# Patient Record
Sex: Male | Born: 1955 | Race: White | Hispanic: No | Marital: Single | Smoking: Former smoker
Health system: Southern US, Community
[De-identification: ages and names within clinical notes are randomized; demographics above are authoritative.]

## PROBLEM LIST (undated history)

## (undated) DIAGNOSIS — J45909 Unspecified asthma, uncomplicated: Secondary | ICD-10-CM

## (undated) DIAGNOSIS — K469 Unspecified abdominal hernia without obstruction or gangrene: Secondary | ICD-10-CM

## (undated) DIAGNOSIS — I10 Essential (primary) hypertension: Secondary | ICD-10-CM

## (undated) DIAGNOSIS — L409 Psoriasis, unspecified: Secondary | ICD-10-CM

## (undated) DIAGNOSIS — N200 Calculus of kidney: Secondary | ICD-10-CM

## (undated) DIAGNOSIS — E669 Obesity, unspecified: Secondary | ICD-10-CM

## (undated) DIAGNOSIS — IMO0002 Reserved for concepts with insufficient information to code with codable children: Secondary | ICD-10-CM

## (undated) DIAGNOSIS — M109 Gout, unspecified: Secondary | ICD-10-CM

## (undated) DIAGNOSIS — M1990 Unspecified osteoarthritis, unspecified site: Secondary | ICD-10-CM

## (undated) DIAGNOSIS — M549 Dorsalgia, unspecified: Secondary | ICD-10-CM

## (undated) HISTORY — DX: Dorsalgia, unspecified: M54.9

## (undated) HISTORY — DX: Unspecified abdominal hernia without obstruction or gangrene: K46.9

## (undated) HISTORY — DX: Psoriasis, unspecified: L40.9

## (undated) HISTORY — DX: Reserved for concepts with insufficient information to code with codable children: IMO0002

## (undated) HISTORY — DX: Unspecified asthma, uncomplicated: J45.909

## (undated) HISTORY — DX: Essential (primary) hypertension: I10

## (undated) HISTORY — DX: Calculus of kidney: N20.0

## (undated) HISTORY — DX: Obesity, unspecified: E66.9

## (undated) HISTORY — DX: Unspecified osteoarthritis, unspecified site: M19.90

---
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
## 1988-04-15 DIAGNOSIS — N200 Calculus of kidney: Secondary | ICD-10-CM

## 1988-04-15 HISTORY — DX: Calculus of kidney: N20.0

## 2006-04-15 DIAGNOSIS — L409 Psoriasis, unspecified: Secondary | ICD-10-CM

## 2006-04-15 HISTORY — DX: Psoriasis, unspecified: L40.9

## 2007-04-16 DIAGNOSIS — I10 Essential (primary) hypertension: Secondary | ICD-10-CM

## 2007-04-16 HISTORY — DX: Essential (primary) hypertension: I10

## 2008-04-15 DIAGNOSIS — K469 Unspecified abdominal hernia without obstruction or gangrene: Secondary | ICD-10-CM

## 2008-04-15 HISTORY — DX: Unspecified abdominal hernia without obstruction or gangrene: K46.9

## 2008-04-15 HISTORY — PX: HERNIA REPAIR: SHX51

## 2009-01-21 ENCOUNTER — Inpatient Hospital Stay: Payer: Self-pay | Admitting: General Surgery

## 2009-04-15 HISTORY — PX: KIDNEY STONE SURGERY: SHX686

## 2009-10-05 ENCOUNTER — Ambulatory Visit: Payer: Self-pay | Admitting: Urology

## 2010-03-28 ENCOUNTER — Ambulatory Visit: Payer: Self-pay | Admitting: Urology

## 2011-04-16 DIAGNOSIS — M549 Dorsalgia, unspecified: Secondary | ICD-10-CM

## 2011-04-16 HISTORY — DX: Dorsalgia, unspecified: M54.9

## 2012-03-09 ENCOUNTER — Ambulatory Visit: Payer: Self-pay | Admitting: General Surgery

## 2012-03-10 ENCOUNTER — Ambulatory Visit: Payer: Self-pay | Admitting: Anesthesiology

## 2012-03-10 LAB — CBC WITH DIFFERENTIAL/PLATELET
Basophil %: 0.9 %
Eosinophil #: 0.4 10*3/uL (ref 0.0–0.7)
HGB: 14.9 g/dL (ref 13.0–18.0)
Lymphocyte #: 1.7 10*3/uL (ref 1.0–3.6)
Lymphocyte %: 19.5 %
MCHC: 33.7 g/dL (ref 32.0–36.0)
MCV: 93 fL (ref 80–100)
Neutrophil #: 5.9 10*3/uL (ref 1.4–6.5)
RBC: 4.75 10*6/uL (ref 4.40–5.90)

## 2012-03-10 LAB — BASIC METABOLIC PANEL
BUN: 22 mg/dL — ABNORMAL HIGH (ref 7–18)
Calcium, Total: 9.4 mg/dL (ref 8.5–10.1)
Chloride: 108 mmol/L — ABNORMAL HIGH (ref 98–107)
Co2: 29 mmol/L (ref 21–32)
Creatinine: 1.22 mg/dL (ref 0.60–1.30)
EGFR (Non-African Amer.): >60
Osmolality: 283 (ref 275–301)
Potassium: 4.4 mmol/L (ref 3.5–5.1)
Sodium: 141 mmol/L (ref 136–145)

## 2012-03-17 ENCOUNTER — Ambulatory Visit: Payer: Self-pay | Admitting: General Surgery

## 2012-03-28 ENCOUNTER — Encounter: Payer: Self-pay | Admitting: *Deleted

## 2014-06-13 IMAGING — CT CT ABDOMEN W/ CM
1 of 4 series · 13 of 32 positions shown, 19 images · non-contrast
Comparison: none

REASON FOR EXAM: LABS 1st recurrent incisional hernia
COMMENTS:

[Series 2: abd 3mm w 3.0 i40f 3 · axial · 0.87mm/px · z∈[-986,-698]mm · 13 of 113 slices shown, 19 images]
[im 9/113  soft-tissue]
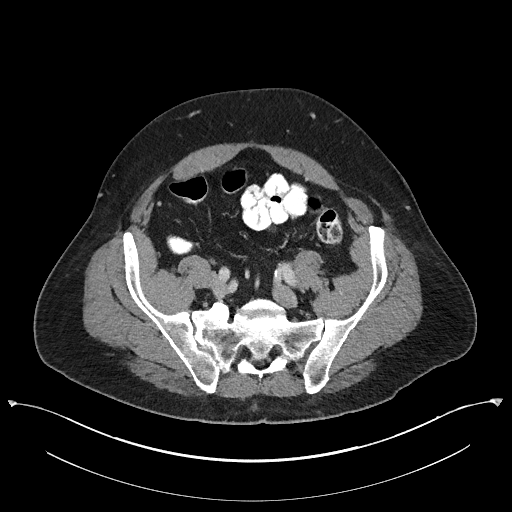
[im 9/113  bone]
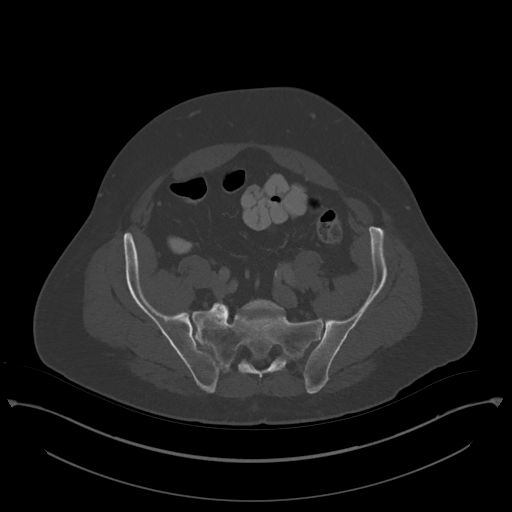
[im 17/113  soft-tissue]
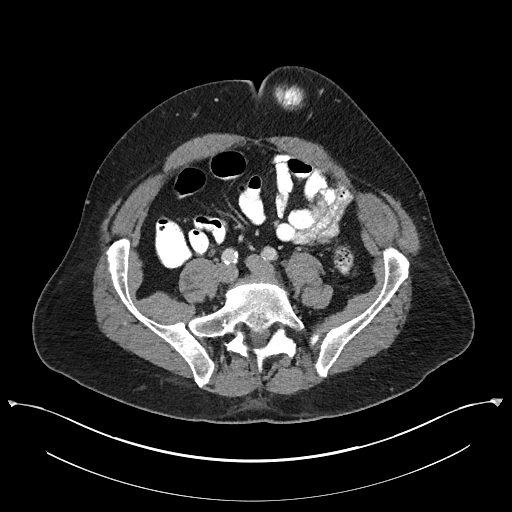
[im 25/113  soft-tissue]
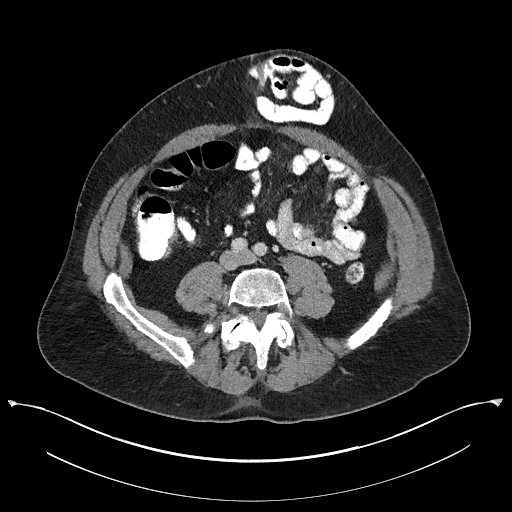
[im 33/113  soft-tissue]
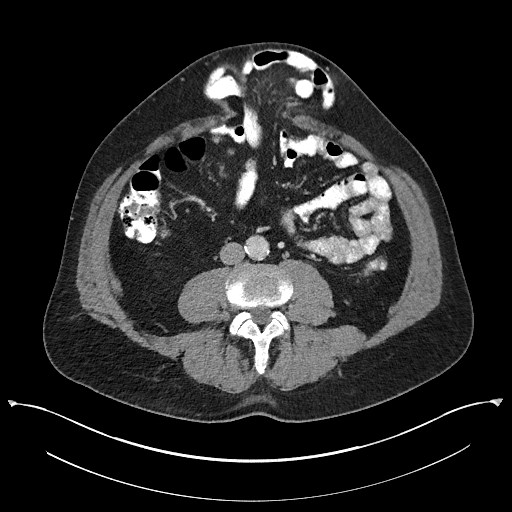
[im 41/113  soft-tissue]
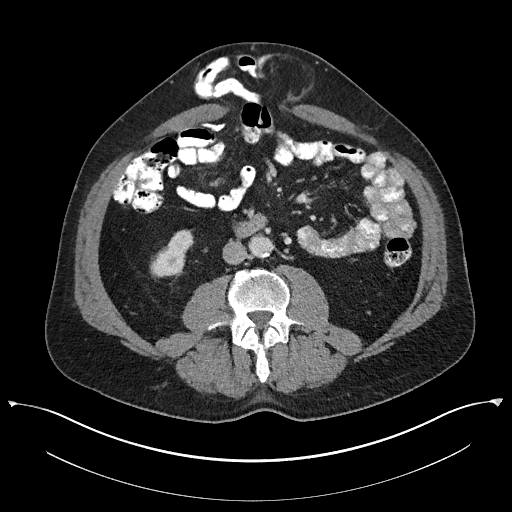
[im 49/113  soft-tissue]
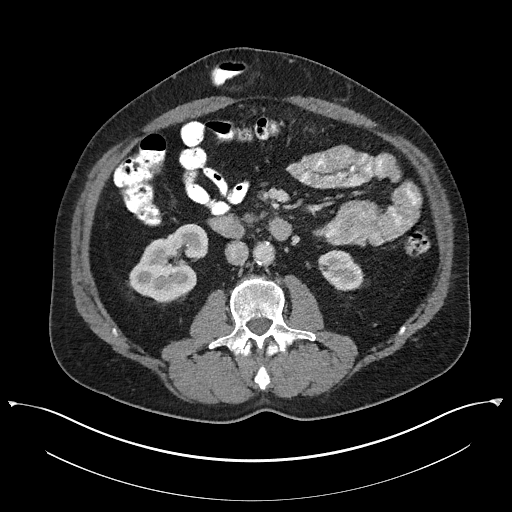
[im 57/113  soft-tissue]
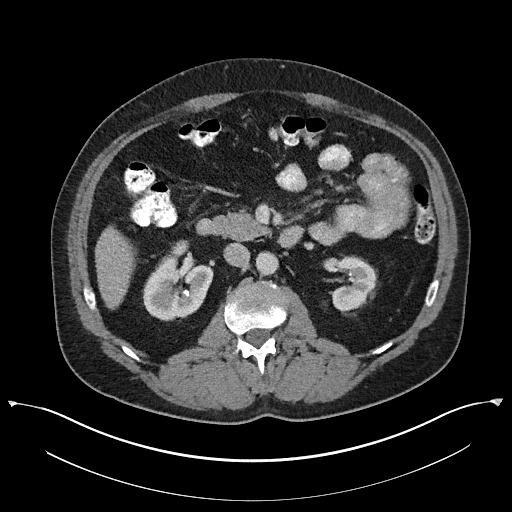
[im 65/113  soft-tissue]
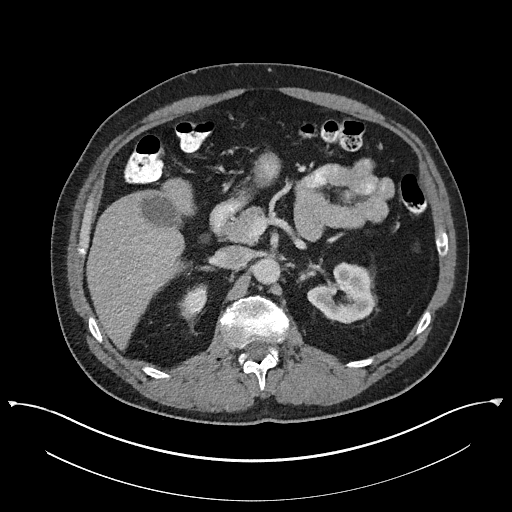
[im 73/113  soft-tissue]
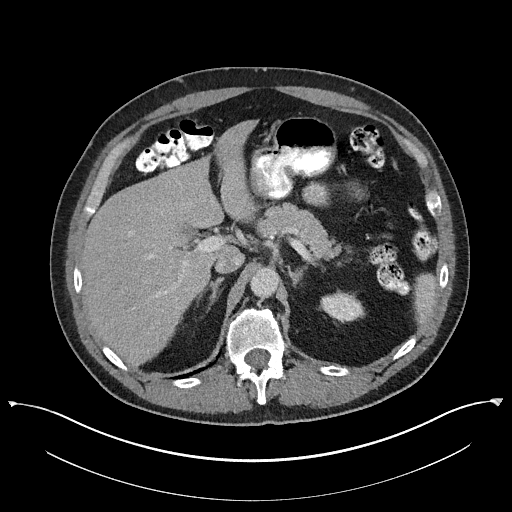
[im 73/113  bone]
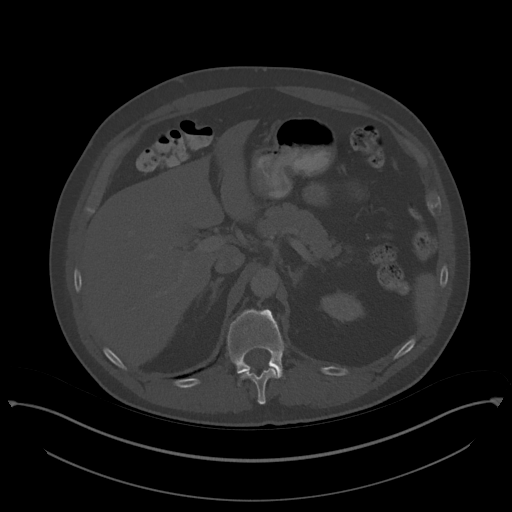
[im 81/113  soft-tissue]
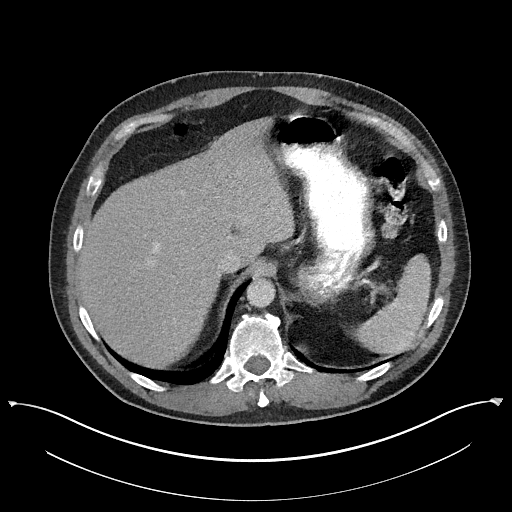
[im 81/113  lung]
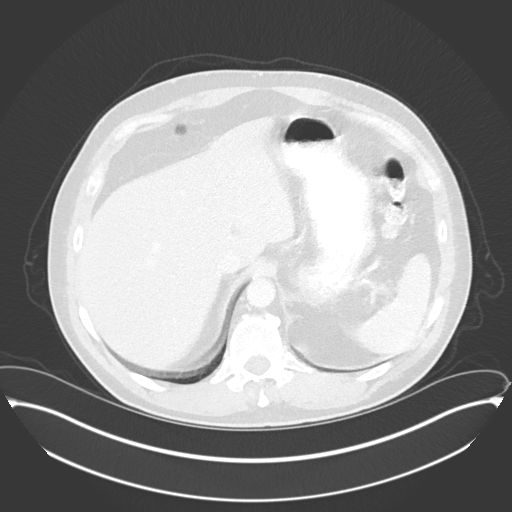
[im 89/113  soft-tissue]
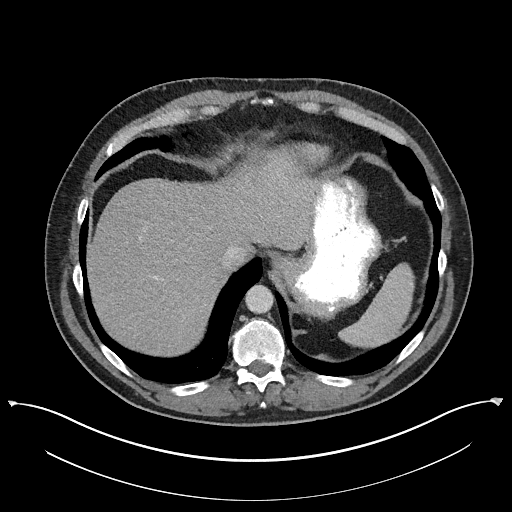
[im 89/113  lung]
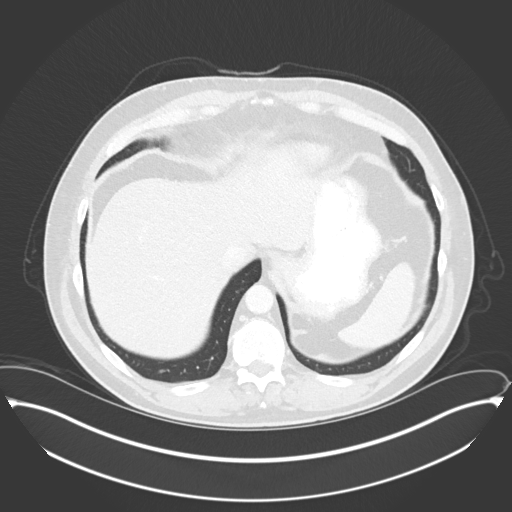
[im 97/113  soft-tissue]
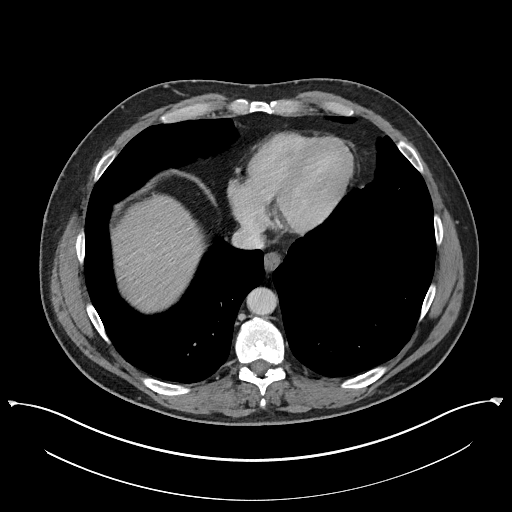
[im 97/113  lung]
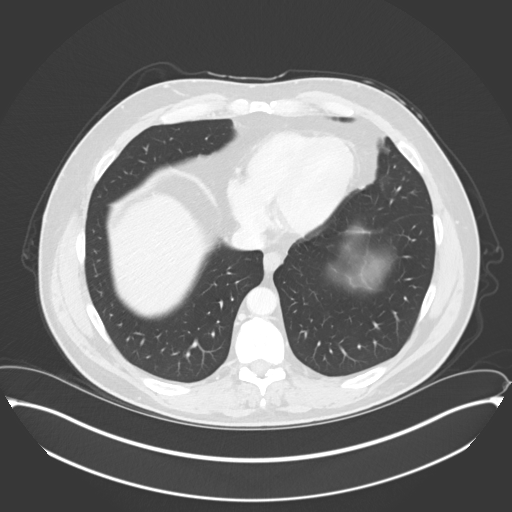
[im 105/113  soft-tissue]
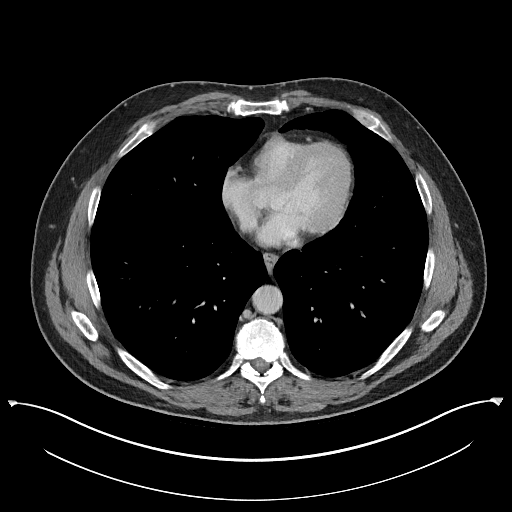
[im 105/113  lung]
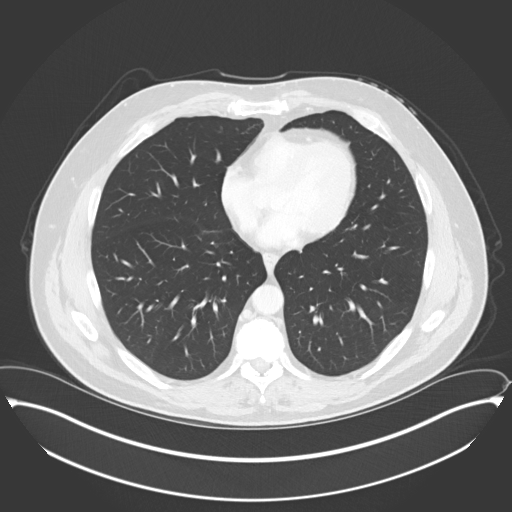

[13 of 32 positions shown; findings below may reference images not displayed]

PROCEDURE:     KCT - KCT ABDOMEN STANDARD W  - March 09, 2012  [DATE]

RESULT:     Axial CT scanning was performed through the abdomen with
reconstructions at 3 mm intervals and slice thicknesses following
intravenous administration of 85 cc of Isovue 300 as well as administration
of oral contrast material. Review of multiplanar reconstructed images was
performed separately on the VIA monitor.

There is a broadly necked ventral hernia containing loops of small bowel.
These loops are not abnormally distended. The remainder of the small and
large bowel exhibits no evidence of ileus or obstruction were visualized.
The kidneys contain nonobstructing stones measuring up to 4 mm in diameter.
The liver, gallbladder, pancreas, spleen, partially distended stomach, and
adrenal glands are normal in appearance. The caliber of the abdominal aorta
is normal. The lumbar vertebral bodies are preserved in height. The lung
bases are clear.

On delayed images contrast within the renal collecting systems appears
normal.
IMPRESSION: 1. There is a large midline ventral hernia containing numerous loops of
normal appearing small bowel as well is normal-appearing fat. There is no
evidence of a small or large bowel obstruction.
2. There is no evidence of acute hepatobiliary abnormality.
3. There are multiple subcentimeter nonobstructing kidney stones.

[REDACTED]

## 2015-05-16 NOTE — Discharge Summary (Signed)
 Discharge Summary  Admit date: 05/15/2015  Discharge date and time: 05/16/2015   Discharge to:  Home  Discharge Service: Cleop[facility] [place] [phone])  Discharge Attending Physician: Hoy Hails, MD  Discharge  Diagnoses: Incisional Hernia  Secondary Diagnosis: Active Problems:   * No active hospital problems. *   OR Procedures: Procedure(s): ROBOTIC XI LAPAROSCOPY, SURGICAL, REPAIR, INCISIONAL HERNIA; REDUCIBLE 05/15/2015   Ancillary Procedures: no procedures  Discharge Day Services:  The patient was seen and examined by the GI Surgery team on the day of discharge. Vital signs and laboratory values were stable and within normal limits. Surgical wounds were examined. Discharge plan was discussed, instructions were given, and all questions answered  Objective   T: 36 P 80 Resp 17 BP 114/78  Gen: NAD, lying in bed CV: RRR Pulm: Breathing comfortably on room air Abd: S/NT/ND, laparoscopic incisions c/d/i w/ no surrounding erythema or drainage Ext: [facility]  Hospital Course:   The patient was taken to the OR on 05/15/15 for a robotic ventral hernia repair with mesh. He tolerated the procedure well, was extubated in the OR, and was taken to the PACU where he received routine postoperative care before being transferred to the floor. He did well postoperatively.  His diet was slowly advanced and at the time of discharge he was tolerating the expected diet.  The patient was able to void spontaneously, have his pain controlled with P.O. pain medication, and ambulate well with minimal assistance. He is being discharged home on POD 1 in stable condition.  Condition at Discharge: Improved Discharge Medications:    Your Medication List    START taking these medications         HYDROcodone-acetaminophen  5-325 mg per tablet  Commonly known as:  NORCO  Take 1 tablet by mouth every four (4) hours as needed. for up to 7 days       CONTINUE taking these medications         clobetasol 0.05 %  ointment  Commonly known as:  TEMOVATE  Apply topically Two (2) times a day.     etanercept 50 mg/mL (0.98 mL) Pnij  Commonly known as:  ENBREL  Inject 1 mL (50 mg total) under the skin Two (2) times a week.     hydroCHLOROthiazide  25 MG tablet  Commonly known as:  HYDRODIURIL   Take 2 tablets (50 mg total) by mouth daily.     triamcinolone  0.1 % ointment  Commonly known as:  KENALOG   Apply topically Two (2) times a day.          Discharge Instructions:  Labs and Other Follow-ups after Discharge:     Follow Up instructions and Outpatient Referrals    Discharge instructions      ACTIVITY:  - You may participate in general, routine activity as tolerated.   - Avoid lifting >10-15 lb or significant straining until seen for follow-up.  - You should wear your abdominal binder for comfort.   DIET:   - Resume your regular diet, as tolerated, unless specifically directed otherwise.   - You may experience bloating or have loose, watery stool for several days after surgery.  INCISION CARE:  --Look at your incisions at least twice a day. A small amount of drainage is normal.  --If your doctor put surgical glue on your incisions, it will fall off gradually by itself, usually about 7-10 days after surgery. Do not pull them off yourself. You may trim the edges should they start to curl. You can  put gauze over the incisions for the first week to protect clothing from any drainage. Avoid wearing tight clothing. --Loosely pack open portions of wound with moist gauze twice daily.  Cover with dry gauze and paper tape.  MEDICATIONS:  - Take prescribed medications as instructed. Do not drive while taking narcotics. You should take an over the counter stool softener while taking narcotic medications.   - Restart any home medications you may have been taking.  GENERAL CONCERNS AND FOLLOW-UP:   - Call at the numbers below for: fever >101.64F, uncontrolled nausea or vomiting, pain uncontrolled  with prescribed medication, or inability to pass stool for more than 3 days; also call for any new or concerning symptoms.    - If you did not receive your followup appointment details at the time of discharge and need an appointment, please  contact the GI Surgery Clinic at [phone].   CONTACTS: - During regular business hours: (Monday-Friday, 8am-4:30 pm) you may call the GI Surgery Clinic at [phone].  - During regular business hours you may call the Trauma/General Surgery Clinic at [phone] - For emergencies hours business hours please call the [facility] operator at [phone] and ask for the Surgical Resident on-call.              Future Appointments:   Inbasket message sent to GI surgery clinic office.   I saw and evaluated the patient, participating in the key portions of the service.  I reviewed the resident's note.  I agree with the resident's findings and plan.   Hoy JAYSON Hails, MD MBA Division of GI and Bariatric Surgery 05/17/2015 10:18 AM

## 2022-01-30 ENCOUNTER — Ambulatory Visit
Admission: EM | Admit: 2022-01-30 | Discharge: 2022-01-30 | Disposition: A | Discharge status: Home / Self Care | Payer: Non-veteran care

## 2022-01-30 DIAGNOSIS — M109 Gout, unspecified: Secondary | ICD-10-CM

## 2022-01-30 MED ORDER — COLCHICINE 0.6 MG PO TABS: ORAL_TABLET | ORAL | 0 refills | Status: AC

## 2022-01-30 NOTE — ED Triage Notes (Signed)
Pt. Presents to UC w/ right foot pain and redness since this morning.Pt endorses tenderness to the site. Pt. Sates he has a hx of gout.

## 2022-01-30 NOTE — ED Provider Notes (Signed)
Ryan Gates    CSN: [id number] Arrival date & time: 01/30/22  1703      History   Chief Complaint Chief Complaint  Patient presents with   Foot Pain    HPI PETERSON Gates is a 66 y.o. male.    Foot Pain    Patient presents to urgent care with complaint of acute right foot pain and redness since this morning.  Endorses tenderness at the site as well as history of gout.  Past Medical History:  Diagnosis Date   Arthritis    Asthma    Back pain 2013   Hernia 2010   Hypertension 2009   Kidney stone 1990   Obesity    Psoriasis 2008   Ulcer     There are no problems to display for this patient.   Past Surgical History:  Procedure Laterality Date   HERNIA REPAIR  2010   KIDNEY STONE SURGERY  2011   TONSILLECTOMY  1964       Home Medications    Prior to Admission medications   Medication Sig Start Date End Date Taking? Authorizing Provider  atorvastatin (LIPITOR) 40 MG tablet TAKE ONE TABLET BY MOUTH AT BEDTIME FOR CHOLESTEROL 07/10/21  Yes [provider]  augmented betamethasone dipropionate (DIPROLENE-AF) 0.05 % cream APPLY SMALL AMOUNT TO AFFECTED AREA TWICE A DAY (USE ON PSORIASIS TWICE A DAY FOR 2-3 WEEKS, THEN ONLY ON WEEKENDS - CAN USE MORE FREQUENTLY FOR FLARES - DO NOT ISE MORE THAN 14 DAYS A MONTH ON THE SAME AREA) (CONVERTED FROM CLOBETASOL) 05/09/21  Yes [provider]  Cholecalciferol 50 MCG (2000 UT) TABS TAKE ONE TABLET BY MOUTH DAILY --- START TAKING AFTER COMPLETING ONCE-WEEKLY ERGOCALCIFEROL 07/10/21  Yes [provider]  etanercept (ENBREL) 50 MG/ML injection Inject into the skin. 03/27/15  Yes [provider]  hydrochlorothiazide (HYDRODIURIL) 25 MG tablet Take by mouth. 04/26/14  Yes [provider]  Ixekizumab 80 MG/ML SOAJ INJECT 80MG  (1 AUTOINJECTOR) SUBCUTANEOUSLY EVERY 4 WEEKS FOR PSORIASIS 07/20/21  Yes [provider]  lisinopril (ZESTRIL) 20 MG tablet Take 1 tablet by mouth  daily. 03/13/21  Yes [provider]  pantoprazole (PROTONIX) 40 MG tablet TAKE ONE TABLET BY MOUTH TWICE A DAY FOR GERD FOR REFLUX/HEARTBURN (TAKE ON AN EMPTY STOMACH 30 MINUTES PRIOR TO A MEAL) 04/18/21  Yes [provider]  meloxicam (MOBIC) 7.5 MG tablet Take 7.5 mg by mouth daily.    [provider]    Family History History reviewed. No pertinent family history.  Social History     Allergies   Other   Review of Systems Review of Systems   Physical Exam Triage Vital Signs ED Triage Vitals [01/30/22 1718]  Enc Vitals Group     BP (!) 140/95     Pulse Rate (!) 114     Resp 18     Temp 98.2 F (36.8 C)     Temp src      SpO2 95 %     Weight      Height      Head Circumference      Peak Flow      Pain Score 5     Pain Loc      Pain Edu?      Excl. in [place]?    No data found.  Updated Vital Signs BP (!) 140/95   Pulse (!) 114   Temp 98.2 F (36.8 C)   Resp 18  SpO2 95%   Visual Acuity Right Eye Distance:   Left Eye Distance:   Bilateral Distance:    Right Eye Near:   Left Eye Near:    Bilateral Near:     Physical Exam Vitals reviewed.  Constitutional:      Appearance: Normal appearance.  Musculoskeletal:       Feet:  Skin:    General: Skin is warm and dry.  Neurological:     General: No focal deficit present.     Mental Status: He is alert and oriented to person, place, and time.  Psychiatric:        Mood and Affect: Mood normal.        Behavior: Behavior normal.      UC Treatments / Results  Labs (all labs ordered are listed, but only abnormal results are displayed) Labs Reviewed - No data to display  EKG   Radiology No results found.  Procedures Procedures (including critical care time)  Medications Ordered in UC Medications - No data to display  Initial Impression / Assessment and Plan / UC Course  I have reviewed the triage vital signs and the nursing notes.  Pertinent labs & imaging results  that were available during my care of the patient were reviewed by me and considered in my medical decision making (see chart for details).   Colchicine prescribed for acute gout. Pt is advised to return to [place] for refills or if symptoms do not improve or recurs.   Final Clinical Impressions(s) / UC Diagnoses   Final diagnoses:  None   Discharge Instructions   None    ED Prescriptions   None    PDMP not reviewed this encounter.   Charma Igo, [place] 01/30/22 1732

## 2022-01-30 NOTE — Discharge Instructions (Addendum)
Follow up with your primary care provider if your symptoms are worsening or not improving with treatment. Follow-up if flares recur.

## 2022-09-11 ENCOUNTER — Ambulatory Visit (INDEPENDENT_AMBULATORY_CARE_PROVIDER_SITE_OTHER): Payer: No Typology Code available for payment source | Admitting: Urology

## 2022-09-11 VITALS — BP 121/85 | HR 114 | Ht 68.0 in | Wt 215.0 lb

## 2022-09-11 DIAGNOSIS — Z8739 Personal history of other diseases of the musculoskeletal system and connective tissue: Secondary | ICD-10-CM

## 2022-09-11 DIAGNOSIS — N200 Calculus of kidney: Secondary | ICD-10-CM

## 2022-09-11 LAB — URINALYSIS, COMPLETE
Bilirubin, UA: NEGATIVE
Glucose, UA: NEGATIVE
Ketones, UA: NEGATIVE
Leukocytes,UA: NEGATIVE
Nitrite, UA: NEGATIVE
Protein,UA: NEGATIVE
RBC, UA: NEGATIVE
Specific Gravity, UA: 1.025 (ref 1.005–1.030)
Urobilinogen, Ur: 0.2 mg/dL (ref 0.2–1.0)
pH, UA: 6 (ref 5.0–7.5)

## 2022-09-11 LAB — MICROSCOPIC EXAMINATION

## 2022-09-11 NOTE — Progress Notes (Signed)
Ryan Gates,acting as a scribe for Vanna Scotland, MD.,have documented all relevant documentation on the behalf of Vanna Scotland, MD,as directed by  Vanna Scotland, MD while in the presence of Vanna Scotland, MD.  09/11/2022 11:30 AM   Ryan Gates 1955-05-28 [id number]  Referring provider: [facility] [address] [place],  [place] [address]  Chief Complaint  Patient presents with   New Patient (Initial Visit)   Nephrolithiasis    HPI: 67 year-old male who is referred by the [facility] for further evaluation of a kidney stone. He was seen at the [facility] on 09/03/2022. He was complaining of right severe flank pain that radiated to his scrotum. He has had kidney stones in the past. He had a KUB that showed non-obstructing stones. His creatinine was elevated at 1.4. His baseline is unknown. He had primarily blood in his urine. He had a CT scan that showed a 3 mm right UVJ stone with mild right hydroureteronephrosis. He also has 2 right upper pole renal calculi measuring 6 mm, a 4 mm right interpolar calculus, and a 4 mm right lower pole calculus. He has 2 left upper pole calculi measuring 3 mm and a 5 mm left interpolar calculus. He was started on Flomax and given Torodol, as well as a prescription for NSAIDs. He was referred for further evaluation to a urologist.    A urinalysis today was negative.   Today, he reports that after receiving medication, his pain subsided, and he was able to urinate without difficulty. He has not experienced any pain since then and believes he may have passed the stone. He mentions a history of gout and is currently on allopurinol. He is concerned about the possibility of future stones and inquires about preventive measures.  Results for orders placed or performed in visit on 09/11/22  Microscopic Examination   Urine  Result Value Ref Range   WBC, UA 0-5 0 - 5 /hpf   RBC, Urine 0-2 0 - 2 /hpf   Epithelial Cells (non renal)  0-10 0 - 10 /hpf   Bacteria, UA Few None seen/Few  Urinalysis, Complete  Result Value Ref Range   Specific Gravity, UA 1.025 1.005 - 1.030   pH, UA 6.0 5.0 - 7.5   Color, UA Yellow Yellow   Appearance Ur Clear Clear   Leukocytes,UA Negative Negative   Protein,UA Negative Negative/Trace   Glucose, UA Negative Negative   Ketones, UA Negative Negative   RBC, UA Negative Negative   Bilirubin, UA Negative Negative   Urobilinogen, Ur 0.2 0.2 - 1.0 mg/dL   Nitrite, UA Negative Negative   Microscopic Examination See below:     PMH: Past Medical History:  Diagnosis Date   Arthritis    Asthma    Back pain 2013   Hernia 2010   Hypertension 2009   Kidney stone 1990   Obesity    Psoriasis 2008   Ulcer     Surgical History: Past Surgical History:  Procedure Laterality Date   HERNIA REPAIR  2010   KIDNEY STONE SURGERY  2011   TONSILLECTOMY  1964    Home Medications:  Allergies as of 09/11/2022       Reactions   Other    pollens        Medication List        Accurate as of Sep 11, 2022 11:30 AM. If you have any questions, ask your nurse or doctor.  STOP taking these medications    meloxicam 7.5 MG tablet Commonly known as: MOBIC       TAKE these medications    atorvastatin 40 MG tablet Commonly known as: LIPITOR TAKE ONE TABLET BY MOUTH AT BEDTIME FOR CHOLESTEROL   augmented betamethasone dipropionate 0.05 % cream Commonly known as: DIPROLENE-AF APPLY SMALL AMOUNT TO AFFECTED AREA TWICE A DAY (USE ON PSORIASIS TWICE A DAY FOR 2-3 WEEKS, THEN ONLY ON WEEKENDS - CAN USE MORE FREQUENTLY FOR FLARES - DO NOT ISE MORE THAN 14 DAYS A MONTH ON THE SAME AREA) (CONVERTED FROM CLOBETASOL)   Cholecalciferol 50 MCG (2000 UT) Tabs TAKE ONE TABLET BY MOUTH DAILY --- START TAKING AFTER COMPLETING ONCE-WEEKLY ERGOCALCIFEROL   colchicine 0.6 MG tablet Day 1: Take 2 tablets (1.2 mg) at the first sign of flare, followed by 0.6 mg after 1 hour; maximum total dose:  3 tablets on day 1. Day 2 and thereafter: Take 1 or 2 tablets daily until flare resolves.   diclofenac 50 MG EC tablet Commonly known as: VOLTAREN TAKE ONE TABLET BY MOUTH THREE TIMES A DAY AS NEEDED FOR PAIN START THIS MEDICATION AS NEEDED ON 09/04/22   etanercept 50 MG/ML injection Commonly known as: ENBREL Inject into the skin.   hydrochlorothiazide 25 MG tablet Commonly known as: HYDRODIURIL Take by mouth.   Ixekizumab 80 MG/ML Soaj INJECT 80MG  (1 AUTOINJECTOR) SUBCUTANEOUSLY EVERY 4 WEEKS FOR PSORIASIS   lisinopril 20 MG tablet Commonly known as: ZESTRIL Take 1 tablet by mouth daily.   pantoprazole 40 MG tablet Commonly known as: PROTONIX TAKE ONE TABLET BY MOUTH TWICE A DAY FOR GERD FOR REFLUX/HEARTBURN (TAKE ON AN EMPTY STOMACH 30 MINUTES PRIOR TO A MEAL)   tamsulosin 0.4 MG Caps capsule Commonly known as: FLOMAX TAKE ONE CAPSULE BY MOUTH DAILY (TAKE AT THE SAME TIME EACH DAY 30 MINUTES AFTER A MEAL)   triamcinolone ointment 0.1 % Commonly known as: KENALOG APPLY SMALL AMOUNT TO AFFECTED AREA TWICE A DAY AS NEEDED APPLY TO THE AFFECTED AREA TWICE DAILY AS NEEDED. DO NOT USE ON THE FACE,  GROIN, OR ARMPITS. ***DO NOT USE AS A DAILY MOISTURIZER*** APPLY TO THE AFFECTED AREA TWICE DAILY AS NEEDED. DO NOT USE ON THE FACE,  GROIN, OR ARMPITS. ***DO NOT USE AS A DAILY MOISTURIZER***        Allergies:  Allergies  Allergen Reactions   Other     pollens    Social History:  reports that he has quit smoking. His smoking use included cigarettes. He has never used smokeless tobacco. No history on file for alcohol use and drug use.   Physical Exam: BP 121/85   Pulse (!) 114   Ht 5\' 8"  (1.727 m)   Wt 215 lb (97.5 kg)   BMI 32.69 kg/m   Constitutional:  Alert and oriented, No acute distress. HEENT: [place] AT, moist mucus membranes.  Trachea midline, no masses. Neurologic: Grossly intact, no focal deficits, moving all 4 extremities. Psychiatric: Normal mood and  affect.   Assessment & Plan:    1. Kidney stones  - Likely passed the 3mm right UVJ stone as the patient is no longer in pain and UA is clear. - Multiple non-obstructing stones in both kidneys, largest being 6mm. - We are not recommending any intervention unless he would like to address this prophylactically, which he declined - We discussed general stone prevention techniques including drinking plenty water with goal of producing 2.5 L urine daily, increased citric acid intake,  avoidance of high oxalate containing foods, and decreased salt intake.  Information about dietary recommendations given today.  - Advised to monitor for symptoms of stone passage and to contact the clinic if symptoms recur. - Provided a handout on kidney stone prevention.  2. History of gout - He has recently started allopurinol. - It is unclear whether his stones are uric acid or calcium - Could send a stone analysis if he is able to catch one  Return if symptoms worsen or fail to improve.  [facility] [address] [place], [place] [address] [phone]

## 2023-11-02 ENCOUNTER — Emergency Department

## 2023-11-02 ENCOUNTER — Inpatient Hospital Stay
Admission: EM | Admit: 2023-11-02 | Discharge: 2023-11-06 | DRG: 272 | Disposition: A | Discharge status: Home / Self Care | Attending: Internal Medicine | Admitting: Internal Medicine

## 2023-11-02 ENCOUNTER — Other Ambulatory Visit: Payer: Self-pay

## 2023-11-02 DIAGNOSIS — L409 Psoriasis, unspecified: Secondary | ICD-10-CM

## 2023-11-02 DIAGNOSIS — Z6833 Body mass index (BMI) 33.0-33.9, adult: Secondary | ICD-10-CM | POA: Diagnosis not present

## 2023-11-02 DIAGNOSIS — N400 Enlarged prostate without lower urinary tract symptoms: Secondary | ICD-10-CM | POA: Diagnosis present

## 2023-11-02 DIAGNOSIS — M7989 Other specified soft tissue disorders: Secondary | ICD-10-CM | POA: Diagnosis present

## 2023-11-02 DIAGNOSIS — I871 Compression of vein: Secondary | ICD-10-CM | POA: Diagnosis not present

## 2023-11-02 DIAGNOSIS — N182 Chronic kidney disease, stage 2 (mild): Secondary | ICD-10-CM | POA: Diagnosis present

## 2023-11-02 DIAGNOSIS — E66811 Obesity, class 1: Secondary | ICD-10-CM | POA: Diagnosis present

## 2023-11-02 DIAGNOSIS — Z79899 Other long term (current) drug therapy: Secondary | ICD-10-CM | POA: Diagnosis not present

## 2023-11-02 DIAGNOSIS — I82442 Acute embolism and thrombosis of left tibial vein: Secondary | ICD-10-CM | POA: Diagnosis present

## 2023-11-02 DIAGNOSIS — I82412 Acute embolism and thrombosis of left femoral vein: Secondary | ICD-10-CM | POA: Diagnosis present

## 2023-11-02 DIAGNOSIS — I82402 Acute embolism and thrombosis of unspecified deep veins of left lower extremity: Secondary | ICD-10-CM

## 2023-11-02 DIAGNOSIS — I82422 Acute embolism and thrombosis of left iliac vein: Principal | ICD-10-CM | POA: Diagnosis present

## 2023-11-02 DIAGNOSIS — I10 Essential (primary) hypertension: Secondary | ICD-10-CM | POA: Diagnosis not present

## 2023-11-02 DIAGNOSIS — K219 Gastro-esophageal reflux disease without esophagitis: Secondary | ICD-10-CM | POA: Diagnosis present

## 2023-11-02 DIAGNOSIS — L400 Psoriasis vulgaris: Secondary | ICD-10-CM | POA: Diagnosis present

## 2023-11-02 DIAGNOSIS — I82452 Acute embolism and thrombosis of left peroneal vein: Secondary | ICD-10-CM | POA: Diagnosis present

## 2023-11-02 DIAGNOSIS — Z8249 Family history of ischemic heart disease and other diseases of the circulatory system: Secondary | ICD-10-CM

## 2023-11-02 DIAGNOSIS — Z833 Family history of diabetes mellitus: Secondary | ICD-10-CM

## 2023-11-02 DIAGNOSIS — I824Y2 Acute embolism and thrombosis of unspecified deep veins of left proximal lower extremity: Secondary | ICD-10-CM | POA: Diagnosis not present

## 2023-11-02 DIAGNOSIS — I82492 Acute embolism and thrombosis of other specified deep vein of left lower extremity: Secondary | ICD-10-CM | POA: Diagnosis not present

## 2023-11-02 DIAGNOSIS — Z806 Family history of leukemia: Secondary | ICD-10-CM | POA: Diagnosis not present

## 2023-11-02 DIAGNOSIS — Z8739 Personal history of other diseases of the musculoskeletal system and connective tissue: Secondary | ICD-10-CM

## 2023-11-02 DIAGNOSIS — I82432 Acute embolism and thrombosis of left popliteal vein: Secondary | ICD-10-CM | POA: Diagnosis present

## 2023-11-02 DIAGNOSIS — J45909 Unspecified asthma, uncomplicated: Secondary | ICD-10-CM | POA: Diagnosis present

## 2023-11-02 DIAGNOSIS — Z87442 Personal history of urinary calculi: Secondary | ICD-10-CM

## 2023-11-02 DIAGNOSIS — Z87891 Personal history of nicotine dependence: Secondary | ICD-10-CM | POA: Diagnosis not present

## 2023-11-02 DIAGNOSIS — M109 Gout, unspecified: Secondary | ICD-10-CM | POA: Diagnosis present

## 2023-11-02 DIAGNOSIS — N200 Calculus of kidney: Secondary | ICD-10-CM

## 2023-11-02 DIAGNOSIS — I8290 Acute embolism and thrombosis of unspecified vein: Principal | ICD-10-CM

## 2023-11-02 DIAGNOSIS — I129 Hypertensive chronic kidney disease with stage 1 through stage 4 chronic kidney disease, or unspecified chronic kidney disease: Secondary | ICD-10-CM | POA: Diagnosis present

## 2023-11-02 DIAGNOSIS — E785 Hyperlipidemia, unspecified: Secondary | ICD-10-CM | POA: Diagnosis present

## 2023-11-02 HISTORY — DX: Gout, unspecified: M10.9

## 2023-11-02 LAB — TYPE AND SCREEN
ABO/RH(D): A POS
Antibody Screen: NEGATIVE

## 2023-11-02 LAB — CBC WITH DIFFERENTIAL/PLATELET
Abs Immature Granulocytes: 0.03 K/uL (ref 0.00–0.07)
Basophils Absolute: 0 K/uL (ref 0.0–0.1)
Basophils Relative: 0 %
Eosinophils Absolute: 0.4 K/uL (ref 0.0–0.5)
Eosinophils Relative: 5 %
HCT: 43 % (ref 39.0–52.0)
Hemoglobin: 14 g/dL (ref 13.0–17.0)
Immature Granulocytes: 0 %
Lymphocytes Relative: 23 %
Lymphs Abs: 2.1 K/uL (ref 0.7–4.0)
MCH: 30.6 pg (ref 26.0–34.0)
MCHC: 32.6 g/dL (ref 30.0–36.0)
MCV: 93.9 fL (ref 80.0–100.0)
Monocytes Absolute: 0.9 K/uL (ref 0.1–1.0)
Monocytes Relative: 10 %
Neutro Abs: 5.5 K/uL (ref 1.7–7.7)
Neutrophils Relative %: 62 %
Platelets: 206 K/uL (ref 150–400)
RBC: 4.58 MIL/uL (ref 4.22–5.81)
RDW: 13.5 % (ref 11.5–15.5)
WBC: 8.9 K/uL (ref 4.0–10.5)
nRBC: 0 % (ref 0.0–0.2)

## 2023-11-02 LAB — COMPREHENSIVE METABOLIC PANEL WITH GFR
ALT: 24 U/L (ref 0–44)
AST: 23 U/L (ref 15–41)
Albumin: 3.8 g/dL (ref 3.5–5.0)
Alkaline Phosphatase: 71 U/L (ref 38–126)
Anion gap: 8 (ref 5–15)
BUN: 16 mg/dL (ref 8–23)
CO2: 23 mmol/L (ref 22–32)
Calcium: 9.2 mg/dL (ref 8.9–10.3)
Chloride: 108 mmol/L (ref 98–111)
Creatinine, Ser: 1.14 mg/dL (ref 0.61–1.24)
GFR, Estimated: >60 mL/min (ref >60)
Glucose, Bld: 116 mg/dL — ABNORMAL HIGH (ref 70–99)
Potassium: 4.1 mmol/L (ref 3.5–5.1)
Sodium: 139 mmol/L (ref 135–145)
Total Bilirubin: 1 mg/dL (ref 0.0–1.2)
Total Protein: 7.5 g/dL (ref 6.5–8.1)

## 2023-11-02 LAB — LACTIC ACID, PLASMA
Lactic Acid, Venous: 1.6 mmol/L (ref 0.5–1.9)
Lactic Acid, Venous: 1.4 mmol/L (ref 0.5–1.9)

## 2023-11-02 LAB — APTT: aPTT: 23 s — ABNORMAL LOW (ref 24–36)

## 2023-11-02 LAB — PROTIME-INR
INR: 1 (ref 0.8–1.2)
Prothrombin Time: 13.2 s (ref 11.4–15.2)

## 2023-11-02 MED ORDER — MORPHINE SULFATE (PF) 2 MG/ML IV SOLN: 2.0000 mg | INTRAVENOUS | Status: DC | PRN

## 2023-11-02 MED ORDER — HEPARIN (PORCINE) 25000 UT/250ML-% IV SOLN
1400.0000 [IU]/h | INTRAVENOUS | Status: DC
  Administered 2023-11-02: 1500 [IU]/h via INTRAVENOUS
  Administered 2023-11-03 – 2023-11-05 (×4): 1400 [IU]/h via INTRAVENOUS
  Filled 2023-11-02 (×5): qty 250

## 2023-11-02 MED ORDER — TAMSULOSIN HCL 0.4 MG PO CAPS
0.4000 mg | ORAL_CAPSULE | Freq: Every day | ORAL | Status: DC
  Administered 2023-11-03 – 2023-11-06 (×4): 0.4 mg via ORAL
  Filled 2023-11-02 (×4): qty 1

## 2023-11-02 MED ORDER — LISINOPRIL 20 MG PO TABS
20.0000 mg | ORAL_TABLET | Freq: Every day | ORAL | Status: DC
  Administered 2023-11-03 – 2023-11-06 (×4): 20 mg via ORAL
  Filled 2023-11-02 (×4): qty 1

## 2023-11-02 MED ORDER — PANTOPRAZOLE SODIUM 40 MG PO TBEC
40.0000 mg | DELAYED_RELEASE_TABLET | Freq: Every day | ORAL | Status: DC
  Administered 2023-11-03 – 2023-11-06 (×4): 40 mg via ORAL
  Filled 2023-11-02 (×4): qty 1

## 2023-11-02 MED ORDER — ACETAMINOPHEN 325 MG PO TABS
650.0000 mg | ORAL_TABLET | Freq: Four times a day (QID) | ORAL | Status: DC | PRN
  Administered 2023-11-04: 650 mg via ORAL
  Filled 2023-11-02: qty 2

## 2023-11-02 MED ORDER — IOHEXOL 350 MG/ML SOLN
100.0000 mL | Freq: Once | INTRAVENOUS | Status: AC | PRN
  Administered 2023-11-02: 100 mL via INTRAVENOUS

## 2023-11-02 MED ORDER — SODIUM CHLORIDE 0.9 % IV SOLN: INTRAVENOUS | Status: AC

## 2023-11-02 MED ORDER — ORAL CARE MOUTH RINSE: 15.0000 mL | OROMUCOSAL | Status: DC | PRN

## 2023-11-02 MED ORDER — HEPARIN BOLUS VIA INFUSION
5500.0000 [IU] | Freq: Once | INTRAVENOUS | Status: AC
  Administered 2023-11-02: 5500 [IU] via INTRAVENOUS
  Filled 2023-11-02: qty 5500

## 2023-11-02 MED ORDER — MAGNESIUM HYDROXIDE 400 MG/5ML PO SUSP: 30.0000 mL | Freq: Every day | ORAL | Status: DC | PRN

## 2023-11-02 MED ORDER — COLCHICINE 0.6 MG PO TABS: 0.6000 mg | ORAL_TABLET | ORAL | Status: DC | PRN

## 2023-11-02 MED ORDER — ACETAMINOPHEN 650 MG RE SUPP: 650.0000 mg | Freq: Four times a day (QID) | RECTAL | Status: DC | PRN

## 2023-11-02 MED ORDER — ONDANSETRON HCL 4 MG PO TABS: 4.0000 mg | ORAL_TABLET | Freq: Four times a day (QID) | ORAL | Status: DC | PRN

## 2023-11-02 MED ORDER — TRAZODONE HCL 50 MG PO TABS
25.0000 mg | ORAL_TABLET | Freq: Every evening | ORAL | Status: DC | PRN
  Administered 2023-11-03 – 2023-11-05 (×2): 25 mg via ORAL
  Filled 2023-11-02 (×2): qty 1

## 2023-11-02 MED ORDER — ATORVASTATIN CALCIUM 20 MG PO TABS
40.0000 mg | ORAL_TABLET | Freq: Every day | ORAL | Status: DC
  Administered 2023-11-03 – 2023-11-06 (×4): 40 mg via ORAL
  Filled 2023-11-02 (×4): qty 2

## 2023-11-02 MED ORDER — HEPARIN SODIUM (PORCINE) 5000 UNIT/ML IJ SOLN: 4000.0000 [IU] | Freq: Once | INTRAMUSCULAR | Status: DC

## 2023-11-02 MED ORDER — ONDANSETRON HCL 4 MG/2ML IJ SOLN: 4.0000 mg | Freq: Four times a day (QID) | INTRAMUSCULAR | Status: DC | PRN

## 2023-11-02 MED ORDER — HYDROCHLOROTHIAZIDE 25 MG PO TABS
25.0000 mg | ORAL_TABLET | Freq: Every day | ORAL | Status: DC
  Administered 2023-11-03: 25 mg via ORAL
  Filled 2023-11-02 (×2): qty 1

## 2023-11-02 MED ORDER — VITAMIN D 25 MCG (1000 UNIT) PO TABS
2000.0000 [IU] | ORAL_TABLET | Freq: Every day | ORAL | Status: DC
  Administered 2023-11-03 – 2023-11-06 (×4): 2000 [IU] via ORAL
  Filled 2023-11-02 (×4): qty 2

## 2023-11-02 NOTE — ED Triage Notes (Signed)
 Pt to ED for LLE redness and swelling since 2 hours ago. Has red plaques from psoriasis to same leg. Redness and heat has extended to just above knee. Denies injury. Denies pain. Denies SOB. Denies hx DM. Hx gout and psoriasis--gets IM immunosupressants.  1 set cultures sent.

## 2023-11-02 NOTE — ED Notes (Signed)
 Spoke with EDP Claudene, verbal order for venous ultrasound for LLE.

## 2023-11-02 NOTE — Progress Notes (Signed)
 PHARMACY - ANTICOAGULATION CONSULT NOTE  Pharmacy Consult for Heparin  Infusion Indication: DVT  Allergies  Allergen Reactions   Other     pollens    Patient Measurements: Height: 5' 8 (172.7 cm) Weight: 97.5 kg (215 lb) IBW/kg (Calculated) : 68.4 HEPARIN  DW (KG): 89.1  Vital Signs: Temp: 98.6 F (37 C) (07/20 1615) Temp Source: Oral (07/20 1615) BP: 132/97 (07/20 1715) Pulse Rate: 99 (07/20 1715)  Labs: Recent Labs    11/02/23 1616  HGB 14.0  HCT 43.0  PLT 206  APTT 23*  LABPROT 13.2  INR 1.0  CREATININE 1.14    Estimated Creatinine Clearance: 68.2 mL/min (by C-G formula based on SCr of 1.14 mg/dL).   Medical History: Past Medical History:  Diagnosis Date   Arthritis    Asthma    Back pain 04/16/2011   Gout    Hernia 04/15/2008   Hypertension 04/16/2007   Kidney stone 04/15/1988   Obesity    Psoriasis 04/15/2006   Ulcer    Assessment: Patient is a 68yo male with redness and swelling of left lower extremity. Ultrasound shows occlusive and non-occlusive DVT. Pharmacy consulted for Heparin  dosing. No prior anticoagulants noted in history, baseline labs within range.  Goal of Therapy:  Heparin  level 0.3-0.7 units/ml Monitor platelets by anticoagulation protocol: Yes   Plan:  Give 5500 units bolus x 1 Start heparin  infusion at 1500 units/hr Check anti-Xa level in 8 hours and daily while on heparin  Continue to monitor H&H and platelets  Olam Fritter, PharmD, BCPS 11/02/2023 7:21 PM

## 2023-11-02 NOTE — ED Provider Notes (Signed)
 [facility] Provider Note    Event Date/Time   First MD Initiated Contact with Patient 11/02/23 1637     (approximate)   History   Leg Swelling   HPI  Ryan Gates is a 68 y.o. male  with a pmh of psoriasis, gout, htn, kidney stones, asthma, arthritis who presents to the emergency department with 2 hours of progressively worsening lower extremity numbness, swelling and erythema.  Patient denies any pain in his extremity.  He was watching TV when symptoms occurred.  Reports that his toes are more dusky than usual this feels different than psoriatic and gout flares he has had previously.  He has no prior history of VTE.  He denies any chest pain or shortness of breath.  He has no prior history of ICH or GI bleeding.       Physical Exam   Triage Vital Signs: ED Triage Vitals  Encounter Vitals Group     BP 11/02/23 1615 (!) 150/115     Girls Systolic BP Percentile --      Girls Diastolic BP Percentile --      Boys Systolic BP Percentile --      Boys Diastolic BP Percentile --      Pulse Rate 11/02/23 1615 (!) 122     Resp 11/02/23 1615 20     Temp 11/02/23 1615 98.6 F (37 C)     Temp Source 11/02/23 1615 Oral     SpO2 11/02/23 1615 97 %     Weight 11/02/23 1615 215 lb (97.5 kg)     Height 11/02/23 1615 5' 8 (1.727 m)     Head Circumference --      Peak Flow --      Pain Score 11/02/23 1613 0     Pain Loc --      Pain Education --      Exclude from Growth Chart --     Most recent vital signs: Vitals:   11/02/23 1615 11/02/23 1715  BP: (!) 150/115 (!) 132/97  Pulse: (!) 122 99  Resp: 20 19  Temp: 98.6 F (37 C)   SpO2: 97% 93%    Nursing Triage Note reviewed. Vital signs reviewed and patients oxygen saturation is normoxic  General: Patient is well nourished, well developed, awake and alert, rHead: Normocephalic and atraumatic Eyes: Normal inspection, extraocular muscles intact, no conjunctival pallor Ear, nose, throat: Normal external  exam Neck: Normal range of motion Respiratory: Patient is in no respiratory distress, lungs CTAB Cardiovascular: Patient is tachycardic, RR without murmur appreciated GI: Abd SNT with no guarding or rebound  Back: Normal inspection of the back with good strength and range of motion throughout all ext Extremities: Left lower extremity with undopplerable DP pulse.  Full strength in all digits, delayed cap refill of 10 seconds, able to feel pressure but reports subjective numbness when compared to the right, there is 1+ lower extremity with pallor over the midfoot and mild erythema approximately and there are psoriatic lesions full range of motion of the knee and ankle without pain Right lower extremity with some sporadic lesions with dopplerable DP pulse Neuro: The patient is alert and oriented to person, place, and time, appropriately conversive, with 5/5 bilat UE/LE strength, no gross motor or sensory defects noted. Coordination appears to be adequate. Skin: Warm, dry, and intact Psych: normal mood and affect, no SI or HI   Media Information  Document Information  Photos    11/02/2023 16:46  Attached To:  Hospital Encounter on 11/02/23  Source Information  Nicholaus Rolland BRAVO, MD  [facility]   ED Results / Procedures / Treatments   Labs (all labs ordered are listed, but only abnormal results are displayed) Labs Reviewed  COMPREHENSIVE METABOLIC PANEL WITH GFR - Abnormal; Notable for the following components:      Result Value   Glucose, Bld 116 (*)    All other components within normal limits  APTT - Abnormal; Notable for the following components:   aPTT 23 (*)    All other components within normal limits  LACTIC ACID, PLASMA  CBC WITH DIFFERENTIAL/PLATELET  PROTIME-INR  LACTIC ACID, PLASMA  TYPE AND SCREEN  TYPE AND SCREEN     EKG EKG and rhythm strip are interpreted by myself:   EKG: [Normal sinus rhythm] at heart rate of 97, normal QRS duration, QTc  437, normal ST segments and T waves no ectopy EKG not consistent with Acute STEMI Rhythm strip: NSR in lead II   RADIOLOGY CTA aorta with runoff: No flow to left foot, poorly timed study, radiologist agrees US  venous doppler LLE: Extensive VTE    PROCEDURES:  Critical Care performed: Yes, see critical care procedure note(s)  .Critical Care  Performed by: Nicholaus Rolland BRAVO, MD Authorized by: Nicholaus Rolland BRAVO, MD   Critical care provider statement:    Critical care time (minutes):  35   Critical care was time spent personally by me on the following activities:  Development of treatment plan with patient or surrogate, discussions with consultants, evaluation of patient's response to treatment, examination of patient, ordering and review of laboratory studies, ordering and review of radiographic studies, ordering and performing treatments and interventions, pulse oximetry, re-evaluation of patient's condition and review of old charts   Care discussed with: admitting provider   Comments:     Management of heparin  bolus and drip for acute VTE    MEDICATIONS ORDERED IN ED: Medications  heparin  bolus via infusion 5,500 Units (has no administration in time range)  heparin  ADULT infusion 100 units/mL (25000 units/250mL) (has no administration in time range)  iohexol  (OMNIPAQUE ) 350 MG/ML injection 100 mL (100 mLs Intravenous Contrast Given 11/02/23 1724)     IMPRESSION / MDM / ASSESSMENT AND PLAN / ED COURSE                                Differential diagnosis includes, but is not limited to, arterial occlusion, DVT, cellulitis, psoriasis, anemia, atypical Raynaud's, less likely gout  ED course: Patient arrives with 2 hours of symptoms including numbness and discoloration of his toes.  I am immediately concerned about arterial occlusion and I am unable to obtain a Doppler pulse.  It is curious to me arrival that the patient still has some capillary perfusion and he denies any pain and  although his presenting symptom was numbness.  Will immediately attain the CT angio aortobifemoral with runoff.  (A venous ultrasound was ordered in triage however I have called radiology and the Aorta will occur first).  He is tachycardic and will obtain an EKG.  He was notified to call me if he develops any pain.   Clinical Course as of 11/02/23 1923  [place] Nov 02, 2023  1700 Unable to doppler pulse in LLE. Called CT scan, he will be next in line given concern for early arterial occlusion [HD]  1840 EKG 12-Lead [HD]  1910 Heparin  bolus drip  ordered  [HD]  1910 Vascular paged [HD]  November 27, 1915 Talk to vascular surgeon and agrees of heparin  bolus with drip.  She thinks this is largely secondary to the venous system.  She recommends admitting to hospitalist and she will come in in the next couple hours to evaluate and consult.  Recommends hospital team get an echo this admission [HD]    Clinical Course User Index [HD] Nicholaus Rolland BRAVO, MD   Data(2/3 categories following were performed): I reviewed or ordered at least three unique tests, external notes, and/or the history required an independent historian as one of the three requirements as following: At least 3 labs/imaging studies were obtained and/or reviewed. AND  I discussed the management of the patient with the following external physician or qualified healthcare provider: Admitting physician  Risk: This patient has a high risk of morbidity due to further diagnostic testing or treatment. Rationale: Decision made regarding admission  Admit Level 5 - Labs/Rads, Admit, Consult: Vascular surgery  Suggested E/M Coding Level: 5, 99285  This level has been selected based on the 26-Nov-2021 CPT guidelines for E/M codes in the Emergency Department based on 2/3 of the CoPA, Data, and Risk.   FINAL CLINICAL IMPRESSION(S) / ED DIAGNOSES   Final diagnoses:  VTE (venous thromboembolism)     Rx / DC Orders   ED Discharge Orders     None         Note:  This document was prepared using Dragon voice recognition software and may include unintentional dictation errors.   Nicholaus Rolland BRAVO, MD 11/02/23 [phone]

## 2023-11-02 NOTE — ED Notes (Signed)
 RN called the floor to let them know pt was on the way. There was no answer.

## 2023-11-02 NOTE — H&P (Incomplete)
 [place]   PATIENT NAME: Ryan Gates    MR#:  [id number]  DATE OF BIRTH:  01-Jul-1955  DATE OF ADMISSION:  11/02/2023  PRIMARY CARE PHYSICIAN: [facility], [facility]   Patient is coming from: Home  REQUESTING/REFERRING PHYSICIAN:, Kreg Moats, MD  CHIEF COMPLAINT:   Chief Complaint  Patient presents with   Leg Swelling    HISTORY OF PRESENT ILLNESS:  Ryan Gates is a 68 y.o. male with medical history significant for osteoarthritis, asthma, gout, hypertension, urolithiasis and psoriasis, who presented to the emergency room with acute onset of worsening left leg swelling that started tingling in the foot followed by warmth and erythema with swelling extending up to his leg.  He he did not any fever or chills.  No chest pain or palpitations.  No cough or wheezing or dyspnea.  No dysuria, oliguria or hematuria or flank pain.  No other bleeding diathesis.  ED Course: When the patient came to the ER, BP was 150/115 with heart rate of 122 with otherwise normal vitals.  Labs revealed unremarkable CMP.  Was 1.6 and later 1.4.  CBC was normal. EKG as reviewed by me :  EKG showed normal sinus rhythm with a rate of 97. Imaging: Left lower extremity DVT revealed evidence of occlusive and nonocclusive thrombus throughout the left lower extremity. CTA of the abdomen and pelvis revealed the following: ASCULAR   1. Markedly limited evaluation of the popliteal arteries bilaterally due to timing of contrast. 2. Discontinuous patency of the bilateral anterior tibial arteries due to atherosclerotic plaque. Otherwise posterior tibial and peroneal arteries vessel runoff to the ankle bilaterally on the delayed images. Left ankle runoff appears slightly delayed compared to the right with no flow noted to the foot. No definite abrupt obstruction of the posterior tibial or peroneal artery. 3.  Aortic Atherosclerosis (ICD10-I70.0).   NON-VASCULAR   1. Subcutaneus soft tissue edema of the  left leg. 2. Cholelithiasis with no acute cholecystitis. 3. Nonobstructive bilateral nephrolithiasis. 4. Colonic diverticulosis with no acute diverticulitis.  The patient was placed on IV heparin  with bolus and drip.  Dr. Gwyndolyn was notified about the patient and evaluated him in the ER.  He will be admitted to a progressive unit bed for further evaluation and management. PAST MEDICAL HISTORY:   Past Medical History:  Diagnosis Date   Arthritis    Asthma    Back pain 04/16/2011   Gout    Hernia 04/15/2008   Hypertension 04/16/2007   Kidney stone 04/15/1988   Obesity    Psoriasis 04/15/2006   Ulcer     PAST SURGICAL HISTORY:   Past Surgical History:  Procedure Laterality Date   HERNIA REPAIR  2010   KIDNEY STONE SURGERY  2011   TONSILLECTOMY  1964    SOCIAL HISTORY:   Social History   Tobacco Use   Smoking status: Former    Types: Cigarettes   Smokeless tobacco: Never  Substance Use Topics   Alcohol use: Not Currently    FAMILY HISTORY:   His mother died from CHF and his father died from leukemia.  This is otherwise positive for diabetes mellitus in his grandmother.  DRUG ALLERGIES:   Allergies  Allergen Reactions   Other     pollens    REVIEW OF SYSTEMS:   ROS As per history of present illness. All pertinent systems were reviewed above. Constitutional, HEENT, cardiovascular, respiratory, GI, GU, musculoskeletal, neuro, psychiatric, endocrine, integumentary and hematologic systems were reviewed and  are otherwise negative/unremarkable except for positive findings mentioned above in the HPI.   MEDICATIONS AT HOME:   Prior to Admission medications   Medication Sig Start Date End Date Taking? Authorizing Provider  atorvastatin  (LIPITOR) 40 MG tablet TAKE ONE TABLET BY MOUTH AT BEDTIME FOR CHOLESTEROL 07/10/21   [provider]  augmented betamethasone dipropionate (DIPROLENE-AF) 0.05 % cream APPLY SMALL AMOUNT TO AFFECTED AREA TWICE A DAY (USE ON  PSORIASIS TWICE A DAY FOR 2-3 WEEKS, THEN ONLY ON WEEKENDS - CAN USE MORE FREQUENTLY FOR FLARES - DO NOT ISE MORE THAN 14 DAYS A MONTH ON THE SAME AREA) (CONVERTED FROM CLOBETASOL) 05/09/21   [provider]  Cholecalciferol  50 MCG (2000 UT) TABS TAKE ONE TABLET BY MOUTH DAILY --- START TAKING AFTER COMPLETING ONCE-WEEKLY ERGOCALCIFEROL  07/10/21   [provider]  colchicine  0.6 MG tablet Day 1: Take 2 tablets (1.2 mg) at the first sign of flare, followed by 0.6 mg after 1 hour; maximum total dose: 3 tablets on day 1. Day 2 and thereafter: Take 1 or 2 tablets daily until flare resolves. 01/30/22   Immordino, Garnette, FNP  diclofenac (VOLTAREN) 50 MG EC tablet TAKE ONE TABLET BY MOUTH THREE TIMES A DAY AS NEEDED FOR PAIN START THIS MEDICATION AS NEEDED ON 09/04/22 09/03/22   [provider]  etanercept (ENBREL) 50 MG/ML injection Inject into the skin. 03/27/15   [provider]  hydrochlorothiazide  (HYDRODIURIL ) 25 MG tablet Take by mouth. 04/26/14   [provider]  Ixekizumab 80 MG/ML SOAJ INJECT 80MG  (1 AUTOINJECTOR) SUBCUTANEOUSLY EVERY 4 WEEKS FOR PSORIASIS 07/20/21   [provider]  lisinopril  (ZESTRIL ) 20 MG tablet Take 1 tablet by mouth daily. 03/13/21   [provider]  pantoprazole  (PROTONIX ) 40 MG tablet TAKE ONE TABLET BY MOUTH TWICE A DAY FOR GERD FOR REFLUX/HEARTBURN (TAKE ON AN EMPTY STOMACH 30 MINUTES PRIOR TO A MEAL) 04/18/21   [provider]  tamsulosin  (FLOMAX ) 0.4 MG CAPS capsule TAKE ONE CAPSULE BY MOUTH DAILY (TAKE AT THE SAME TIME EACH DAY 30 MINUTES AFTER A MEAL) 09/03/22   [provider]  triamcinolone  ointment (KENALOG ) 0.1 % APPLY SMALL AMOUNT TO AFFECTED AREA TWICE A DAY AS NEEDED APPLY TO THE AFFECTED AREA TWICE DAILY AS NEEDED. DO NOT USE ON THE FACE,  GROIN, OR ARMPITS. DO NOT USE AS A DAILY MOISTURIZER APPLY TO THE AFFECTED AREA TWICE DAILY AS NEEDED. DO NOT USE ON THE FACE,  GROIN, OR ARMPITS. DO NOT  USE AS A DAILY MOISTURIZER 05/09/22   [provider]      VITAL SIGNS:  Blood pressure 121/83, pulse 93, temperature 99.1 F (37.3 C), resp. rate 18, height 5' 8 (1.727 m), weight 98.9 kg, SpO2 93%.  PHYSICAL EXAMINATION:  Physical Exam  GENERAL:  68 y.o.-year-old Caucasian male patient lying in the bed with no acute distress.  EYES: Pupils equal, round, reactive to light and accommodation. No scleral icterus. Extraocular muscles intact.  HEENT: Head atraumatic, normocephalic. Oropharynx and nasopharynx clear.  NECK:  Supple, no jugular venous distention. No thyroid enlargement, no tenderness.  LUNGS: Normal breath sounds bilaterally, no wheezing, rales,rhonchi or crepitation. No use of accessory muscles of respiration.  CARDIOVASCULAR: Regular rate and rhythm, S1, S2 normal. No murmurs, rubs, or gallops.  ABDOMEN: Soft, nondistended, nontender. Bowel sounds present. No organomegaly or mass.  EXTREMITIES: Extensive left lower extremity swelling with mild erythema, warmth with negative Homans' sign and no clubbing or cyanosis.   NEUROLOGIC: Cranial nerves II  through XII are intact. Muscle strength 5/5 in all extremities. Sensation intact. Gait not checked.  PSYCHIATRIC: The patient is alert and oriented x 3.  Normal affect and good eye contact. SKIN: Psoriatic eruption obvious on the left leg with no other rash, lesion, or ulcer.      LABORATORY PANEL:   CBC Recent Labs  Lab 11/02/23 1616  WBC 8.9  HGB 14.0  HCT 43.0  PLT 206   ------------------------------------------------------------------------------------------------------------------  Chemistries  Recent Labs  Lab 11/02/23 1616  NA 139  K 4.1  CL 108  CO2 23  GLUCOSE 116*  BUN 16  CREATININE 1.14  CALCIUM  9.2  AST 23  ALT 24  ALKPHOS 71  BILITOT 1.0   ------------------------------------------------------------------------------------------------------------------  Cardiac Enzymes No results  for input(s): TROPONINI in the last 168 hours. ------------------------------------------------------------------------------------------------------------------  RADIOLOGY:  US  Venous Img Lower Unilateral Left Result Date: 11/02/2023 CLINICAL DATA:  Left leg swelling and redness. EXAM: LEFT LOWER EXTREMITY VENOUS DOPPLER ULTRASOUND TECHNIQUE: Gray-scale sonography with graded compression, as well as color Doppler and duplex ultrasound were performed to evaluate the lower extremity deep venous systems from the level of the common femoral vein and including the common femoral, femoral, profunda femoral, popliteal and calf veins including the posterior tibial, peroneal and gastrocnemius veins when visible. The superficial great saphenous vein was also interrogated. Spectral Doppler was utilized to evaluate flow at rest and with distal augmentation maneuvers in the common femoral, femoral and popliteal veins. COMPARISON:  None Available. FINDINGS: Contralateral Common Femoral Vein: Respiratory phasicity is normal and symmetric with the symptomatic side. No evidence of thrombus. Normal compressibility. Common Femoral Vein: Evidence of nonocclusive thrombus within the LEFT common femoral vein with abnormal compressibility, respiratory phasicity and response to augmentation. Saphenofemoral Junction: Evidence of nonocclusive thrombus within the LEFT saphenofemoral junction with abnormal compressibility and flow on color Doppler imaging. Profunda Femoral Vein: Evidence of nonocclusive thrombus within the LEFT profundus femoral vein with abnormal compressibility and flow on color Doppler imaging. Femoral Vein: Evidence of occlusive thrombus within the LEFT femoral vein with abnormal compressibility, respiratory phasicity and response to augmentation. Popliteal Vein: Evidence of occlusive thrombus within the LEFT popliteal vein with abnormal compressibility, respiratory phasicity and response to augmentation. Calf  Veins: Evidence of nonocclusive thrombus within the LEFT posterior tibial vein and LEFT peroneal vein with abnormal compressibility and flow on color Doppler imaging. Superficial Great Saphenous Vein: No evidence of thrombus. Normal compressibility. Venous Reflux:  None. Other Findings:  None. 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   On: 11/02/2023 19:13   CT Angio Aortobifemoral W and/or Wo Contrast Result Date: 11/02/2023 CLINICAL DATA:  Left foot with sudden on swelling, pallor, numbness. Unable to palpate pulse. r LLE redness and swelling since 2 hours ago. Has red plaques from psoriasis to same leg. Redness and heat has extended to just above knee. Denies injury. Denies pain. Denies SOB. EXAM: CT ANGIOGRAPHY OF ABDOMINAL AORTA WITH ILIOFEMORAL RUNOFF TECHNIQUE: Multidetector CT imaging of the abdomen, pelvis and lower extremities was performed using the standard protocol during bolus administration of intravenous contrast. Multiplanar CT image reconstructions and MIPs were obtained to evaluate the vascular anatomy. RADIATION DOSE REDUCTION: This exam was performed according to the departmental dose-optimization program which includes automated exposure control, adjustment of the mA and/or kV according to patient size and/or use of iterative reconstruction technique. CONTRAST:  OMNIPAQUE  IOHEXOL  350 MG/ML SOLN  COMPARISON:  None Available. FINDINGS: VASCULAR Aorta: Severe calcified and noncalcified atherosclerotic plaque. Normal caliber aorta without aneurysm, dissection, vasculitis or significant stenosis. Celiac: Patent without evidence of aneurysm, dissection, vasculitis or significant stenosis. SMA: Patent without evidence of aneurysm, dissection, vasculitis or significant stenosis. Renals: Both renal arteries are patent without evidence of aneurysm, dissection, vasculitis,  fibromuscular dysplasia or significant stenosis. IMA: Patent without evidence of aneurysm, dissection, vasculitis or significant stenosis. RIGHT Lower Extremity Inflow: Moderate atherosclerotic plaque. Common, internal and external iliac arteries are patent without evidence of aneurysm, dissection, vasculitis or significant stenosis. Outflow: Moderate atherosclerotic plaque. Markedly limited evaluation of the popliteal arteries bilaterally due to timing of contrast.Common, superficial and profunda femoral arteries are patent without evidence of aneurysm, dissection, vasculitis or significant stenosis. Runoff: Moderate atherosclerotic plaque. Discontinuous patency of the anterior tibial artery due to atherosclerotic plaque. Patent posterior tibial and peroneal vessel runoff to the ankle on the delayed images. LEFT Lower Extremity Inflow: Moderate atherosclerotic plaque. Common, internal and external iliac arteries are patent without evidence of aneurysm, dissection, vasculitis or significant stenosis. Outflow: Moderate atherosclerotic plaque. Markedly limited evaluation of the popliteal arteries bilaterally due to timing of contrast. Common, superficial and profunda femoral arteries are patent without evidence of aneurysm, dissection, vasculitis or significant stenosis. Runoff: Moderate atherosclerotic plaque. Discontinuous patency of the anterior tibial artery due to atherosclerotic plaque. Patent posterior tibial and peroneal vessel runoff to the ankle on the delayed images that appears slightly delayed compared to the right with no flow noted to the foot. Veins: No obvious venous abnormality within the limitations of this arterial phase study. Review of the MIP images confirms the above findings. NON-VASCULAR Lower chest: No acute abnormality. Hepatobiliary: No focal liver abnormality. Calcified gallstone noted within the gallbladder lumen. No gallbladder wall thickening or pericholecystic fluid. No biliary  dilatation. Pancreas: No focal lesion. Normal pancreatic contour. No surrounding inflammatory changes. No main pancreatic ductal dilatation. Spleen: Normal in size without focal abnormality. Adrenals/Urinary Tract: No adrenal nodule bilaterally. Bilateral kidneys enhance symmetrically. No hydronephrosis. No hydroureter. Bilateral nephrolithiasis measuring up to 3 mm on the left and 6 mm on the right. No ureterolithiasis bilaterally. The urinary bladder is unremarkable. Stomach/Bowel: Stomach is within normal limits. No evidence of bowel wall thickening or dilatation. Colonic diverticulosis. Appendix appears normal. Lymphatic: No lymphadenopathy. Reproductive: Prostate is unremarkable. Other: No intraperitoneal free fluid. No intraperitoneal free gas. No organized fluid collection. Musculoskeletal: No abdominal wall hernia or abnormality. No suspicious lytic or blastic osseous lesions. No acute displaced fracture. Multilevel degenerative changes of the spine. Grade 1 anterolisthesis of L4 on L5. Right L5-S1 pseudoarthrosis. IMPRESSION: VASCULAR 1. Markedly limited evaluation of the popliteal arteries bilaterally due to timing of contrast. 2. Discontinuous patency of the bilateral anterior tibial arteries due to atherosclerotic plaque. Otherwise posterior tibial and peroneal arteries vessel runoff to the ankle bilaterally on the delayed images. Left ankle runoff appears slightly delayed compared to the right with no flow noted to the foot. No definite abrupt obstruction of the posterior tibial or peroneal artery. 3.  Aortic Atherosclerosis (ICD10-I70.0). NON-VASCULAR 1. Subcutaneus soft tissue edema of the left leg. 2. Cholelithiasis with no acute cholecystitis. 3. Nonobstructive bilateral nephrolithiasis. 4. Colonic diverticulosis with no acute diverticulitis. Electronically Signed   By: Morgane  Naveau M.D.   On: 11/02/2023 18:48      IMPRESSION AND PLAN:  Assessment and Plan: * Acute deep vein thrombosis  (DVT) of left lower extremity (HCC) - The patient be admitted to a progressive unit bed. -  Will continue on IV heparin . - Vascular surgery consult by Dr. Tisa is appreciated. - He may need thrombectomy by midweek if he has no improvement.  Dr. Gwyndolyn. - Pain management will be provided. - Given the rapid onset we will need to rule out embolic source. - We will therefore obtain a 2D echo.  Psoriasis - The patient gets outpatient injection of Tremfya. - Will continue Kenalog  ointment.  GERD without esophagitis - Will continue PPI therapy.  BPH (benign prostatic hyperplasia) - We will continue Flomax .  Gout - Will continue allopurinol  and colchicine .  Dyslipidemia - Will continue statin therapy.   DVT prophylaxis: IV heparin  Advanced Care Planning:  Code Status: full code. Family Communication:  The plan of care was discussed in details with the patient (and family). I answered all questions. The patient agreed to proceed with the above mentioned plan. Further management will depend upon hospital course. Disposition Plan: Back to previous home environment Consults called: Vascular All the records are reviewed and case discussed with ED provider.  Status is: Inpatient  At the time of the admission, it appears that the appropriate admission status for this patient is inpatient.  This is judged to be reasonable and necessary in order to provide the required intensity of service to ensure the patient's safety given the presenting symptoms, physical exam findings and initial radiographic and laboratory data in the context of comorbid conditions.  The patient requires inpatient status due to high intensity of service, high risk of further deterioration and high frequency of surveillance required.  I certify that at the time of admission, it is my clinical judgment that the patient will require inpatient hospital care extending more than 2 midnights.                            Dispo:  The patient is from: Home              Anticipated d/c is to: Home              Patient currently is not medically stable to d/c.              Difficult to place patient: No  Madison DELENA Peaches M.D on 11/03/2023 at 12:45 AM  [facility]   From 7 PM-7 AM, contact night-coverage [email or web address]  CC: Primary care physician; Administration, [employer]

## 2023-11-02 NOTE — H&P (Incomplete)
 [place]   PATIENT NAME: Ryan Gates    MR#:  [id number]  DATE OF BIRTH:  1955/06/29  DATE OF ADMISSION:  11/02/2023  PRIMARY CARE PHYSICIAN: [facility], [facility]   Patient is coming from: Home  REQUESTING/REFERRING PHYSICIAN:, Kreg Moats, MD  CHIEF COMPLAINT:   Chief Complaint  Patient presents with  . Leg Swelling    HISTORY OF PRESENT ILLNESS:  Ryan Gates is a 68 y.o. male with medical history significant for osteoarthritis, asthma, gout, hypertension, urolithiasis and psoriasis, who presented to the emergency room with acute onset of worsening left leg swelling that started tingling in the foot followed by warmth and erythema with swelling extending up to his leg.  Did not any fever or chills.  No chest pain or palpitations.  No cough or wheezing or dyspnea.  No dysuria, oliguria or hematuria or flank pain.  No other bleeding diathesis.  ED Course: When the patient came to the ER EKG as reviewed by me : *** Imaging: *** PAST MEDICAL HISTORY:   Past Medical History:  Diagnosis Date  . Arthritis   . Asthma   . Back pain 04/16/2011  . Gout   . Hernia 04/15/2008  . Hypertension 04/16/2007  . Kidney stone 04/15/1988  . Obesity   . Psoriasis 04/15/2006  . Ulcer     PAST SURGICAL HISTORY:   Past Surgical History:  Procedure Laterality Date  . HERNIA REPAIR  2010  . KIDNEY STONE SURGERY  2011  . TONSILLECTOMY  1964    SOCIAL HISTORY:   Social History   Tobacco Use  . Smoking status: Former    Types: Cigarettes  . Smokeless tobacco: Never  Substance Use Topics  . Alcohol use: Not Currently    FAMILY HISTORY:  History reviewed. No pertinent family history.  DRUG ALLERGIES:   Allergies  Allergen Reactions  . Other     pollens    REVIEW OF SYSTEMS:   ROS As per history of present illness. All pertinent systems were reviewed above. Constitutional, HEENT, cardiovascular, respiratory, GI, GU, musculoskeletal, neuro, psychiatric,  endocrine, integumentary and hematologic systems were reviewed and are otherwise negative/unremarkable except for positive findings mentioned above in the HPI.   MEDICATIONS AT HOME:   Prior to Admission medications   Medication Sig Start Date End Date Taking? Authorizing Provider  atorvastatin  (LIPITOR) 40 MG tablet TAKE ONE TABLET BY MOUTH AT BEDTIME FOR CHOLESTEROL 07/10/21   [provider]  augmented betamethasone dipropionate (DIPROLENE-AF) 0.05 % cream APPLY SMALL AMOUNT TO AFFECTED AREA TWICE A DAY (USE ON PSORIASIS TWICE A DAY FOR 2-3 WEEKS, THEN ONLY ON WEEKENDS - CAN USE MORE FREQUENTLY FOR FLARES - DO NOT ISE MORE THAN 14 DAYS A MONTH ON THE SAME AREA) (CONVERTED FROM CLOBETASOL) 05/09/21   [provider]  Cholecalciferol  50 MCG (2000 UT) TABS TAKE ONE TABLET BY MOUTH DAILY --- START TAKING AFTER COMPLETING ONCE-WEEKLY ERGOCALCIFEROL  07/10/21   [provider]  colchicine  0.6 MG tablet Day 1: Take 2 tablets (1.2 mg) at the first sign of flare, followed by 0.6 mg after 1 hour; maximum total dose: 3 tablets on day 1. Day 2 and thereafter: Take 1 or 2 tablets daily until flare resolves. 01/30/22   Immordino, Garnette, FNP  diclofenac (VOLTAREN) 50 MG EC tablet TAKE ONE TABLET BY MOUTH THREE TIMES A DAY AS NEEDED FOR PAIN START THIS MEDICATION AS NEEDED ON 09/04/22 09/03/22   [provider]  etanercept (ENBREL) 50 MG/ML  injection Inject into the skin. 03/27/15   [provider]  hydrochlorothiazide  (HYDRODIURIL ) 25 MG tablet Take by mouth. 04/26/14   [provider]  Ixekizumab 80 MG/ML SOAJ INJECT 80MG  (1 AUTOINJECTOR) SUBCUTANEOUSLY EVERY 4 WEEKS FOR PSORIASIS 07/20/21   [provider]  lisinopril  (ZESTRIL ) 20 MG tablet Take 1 tablet by mouth daily. 03/13/21   [provider]  pantoprazole  (PROTONIX ) 40 MG tablet TAKE ONE TABLET BY MOUTH TWICE A DAY FOR GERD FOR REFLUX/HEARTBURN (TAKE ON AN EMPTY STOMACH 30 MINUTES PRIOR TO A  MEAL) 04/18/21   [provider]  tamsulosin  (FLOMAX ) 0.4 MG CAPS capsule TAKE ONE CAPSULE BY MOUTH DAILY (TAKE AT THE SAME TIME EACH DAY 30 MINUTES AFTER A MEAL) 09/03/22   [provider]  triamcinolone  ointment (KENALOG ) 0.1 % APPLY SMALL AMOUNT TO AFFECTED AREA TWICE A DAY AS NEEDED APPLY TO THE AFFECTED AREA TWICE DAILY AS NEEDED. DO NOT USE ON THE FACE,  GROIN, OR ARMPITS. ***DO NOT USE AS A DAILY MOISTURIZER*** APPLY TO THE AFFECTED AREA TWICE DAILY AS NEEDED. DO NOT USE ON THE FACE,  GROIN, OR ARMPITS. ***DO NOT USE AS A DAILY MOISTURIZER*** 05/09/22   [provider]      VITAL SIGNS:  Blood pressure (!) 140/97, pulse 95, temperature 97.8 F (36.6 C), resp. rate 18, height 5' 8 (1.727 m), weight 98.9 kg, SpO2 98%.  PHYSICAL EXAMINATION:  Physical Exam  GENERAL:  68 y.o.-year-old patient lying in the bed with no acute distress.  EYES: Pupils equal, round, reactive to light and accommodation. No scleral icterus. Extraocular muscles intact.  HEENT: Head atraumatic, normocephalic. Oropharynx and nasopharynx clear.  NECK:  Supple, no jugular venous distention. No thyroid enlargement, no tenderness.  LUNGS: Normal breath sounds bilaterally, no wheezing, rales,rhonchi or crepitation. No use of accessory muscles of respiration.  CARDIOVASCULAR: Regular rate and rhythm, S1, S2 normal. No murmurs, rubs, or gallops.  ABDOMEN: Soft, nondistended, nontender. Bowel sounds present. No organomegaly or mass.  EXTREMITIES: No pedal edema, cyanosis, or clubbing.  NEUROLOGIC: Cranial nerves II through XII are intact. Muscle strength 5/5 in all extremities. Sensation intact. Gait not checked.  PSYCHIATRIC: The patient is alert and oriented x 3.  Normal affect and good eye contact. SKIN: No obvious rash, lesion, or ulcer.   LABORATORY PANEL:   CBC Recent Labs  Lab 11/02/23 1616  WBC 8.9  HGB 14.0  HCT 43.0  PLT 206    ------------------------------------------------------------------------------------------------------------------  Chemistries  Recent Labs  Lab 11/02/23 1616  NA 139  K 4.1  CL 108  CO2 23  GLUCOSE 116*  BUN 16  CREATININE 1.14  CALCIUM  9.2  AST 23  ALT 24  ALKPHOS 71  BILITOT 1.0   ------------------------------------------------------------------------------------------------------------------  Cardiac Enzymes No results for input(s): TROPONINI in the last 168 hours. ------------------------------------------------------------------------------------------------------------------  RADIOLOGY:  US  Venous Img Lower Unilateral Left Result Date: 11/02/2023 CLINICAL DATA:  Left leg swelling and redness. EXAM: LEFT LOWER EXTREMITY VENOUS DOPPLER ULTRASOUND TECHNIQUE: Gray-scale sonography with graded compression, as well as color Doppler and duplex ultrasound were performed to evaluate the lower extremity deep venous systems from the level of the common femoral vein and including the common femoral, femoral, profunda femoral, popliteal and calf veins including the posterior tibial, peroneal and gastrocnemius veins when visible. The superficial great saphenous vein was also interrogated. Spectral Doppler was utilized to evaluate flow at rest and with distal augmentation maneuvers in the common femoral, femoral and popliteal veins. COMPARISON:  None Available. FINDINGS:  Contralateral Common Femoral Vein: Respiratory phasicity is normal and symmetric with the symptomatic side. No evidence of thrombus. Normal compressibility. Common Femoral Vein: Evidence of nonocclusive thrombus within the LEFT common femoral vein with abnormal compressibility, respiratory phasicity and response to augmentation. Saphenofemoral Junction: Evidence of nonocclusive thrombus within the LEFT saphenofemoral junction with abnormal compressibility and flow on color Doppler imaging. Profunda Femoral Vein: Evidence  of nonocclusive thrombus within the LEFT profundus femoral vein with abnormal compressibility and flow on color Doppler imaging. Femoral Vein: Evidence of occlusive thrombus within the LEFT femoral vein with abnormal compressibility, respiratory phasicity and response to augmentation. Popliteal Vein: Evidence of occlusive thrombus within the LEFT popliteal vein with abnormal compressibility, respiratory phasicity and response to augmentation. Calf Veins: Evidence of nonocclusive thrombus within the LEFT posterior tibial vein and LEFT peroneal vein with abnormal compressibility and flow on color Doppler imaging. Superficial Great Saphenous Vein: No evidence of thrombus. Normal compressibility. Venous Reflux:  None. Other Findings:  None. 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   On: 11/02/2023 19:13   CT Angio Aortobifemoral W and/or Wo Contrast Result Date: 11/02/2023 CLINICAL DATA:  Left foot with sudden on swelling, pallor, numbness. Unable to palpate pulse. r LLE redness and swelling since 2 hours ago. Has red plaques from psoriasis to same leg. Redness and heat has extended to just above knee. Denies injury. Denies pain. Denies SOB. EXAM: CT ANGIOGRAPHY OF ABDOMINAL AORTA WITH ILIOFEMORAL RUNOFF TECHNIQUE: Multidetector CT imaging of the abdomen, pelvis and lower extremities was performed using the standard protocol during bolus administration of intravenous contrast. Multiplanar CT image reconstructions and MIPs were obtained to evaluate the vascular anatomy. RADIATION DOSE REDUCTION: This exam was performed according to the departmental dose-optimization program which includes automated exposure control, adjustment of the mA and/or kV according to patient size and/or use of iterative reconstruction technique. CONTRAST:  OMNIPAQUE  IOHEXOL  350 MG/ML SOLN COMPARISON:  None  Available. FINDINGS: VASCULAR Aorta: Severe calcified and noncalcified atherosclerotic plaque. Normal caliber aorta without aneurysm, dissection, vasculitis or significant stenosis. Celiac: Patent without evidence of aneurysm, dissection, vasculitis or significant stenosis. SMA: Patent without evidence of aneurysm, dissection, vasculitis or significant stenosis. Renals: Both renal arteries are patent without evidence of aneurysm, dissection, vasculitis, fibromuscular dysplasia or significant stenosis. IMA: Patent without evidence of aneurysm, dissection, vasculitis or significant stenosis. RIGHT Lower Extremity Inflow: Moderate atherosclerotic plaque. Common, internal and external iliac arteries are patent without evidence of aneurysm, dissection, vasculitis or significant stenosis. Outflow: Moderate atherosclerotic plaque. Markedly limited evaluation of the popliteal arteries bilaterally due to timing of contrast.Common, superficial and profunda femoral arteries are patent without evidence of aneurysm, dissection, vasculitis or significant stenosis. Runoff: Moderate atherosclerotic plaque. Discontinuous patency of the anterior tibial artery due to atherosclerotic plaque. Patent posterior tibial and peroneal vessel runoff to the ankle on the delayed images. LEFT Lower Extremity Inflow: Moderate atherosclerotic plaque. Common, internal and external iliac arteries are patent without evidence of aneurysm, dissection, vasculitis or significant stenosis. Outflow: Moderate atherosclerotic plaque. Markedly limited evaluation of the popliteal arteries bilaterally due to timing of contrast. Common, superficial and profunda femoral arteries are patent without evidence of aneurysm, dissection, vasculitis or significant stenosis. Runoff: Moderate atherosclerotic plaque. Discontinuous patency of the anterior tibial artery due to atherosclerotic plaque. Patent posterior tibial and peroneal vessel runoff to the ankle on the  delayed images that appears slightly delayed compared to the right with  no flow noted to the foot. Veins: No obvious venous abnormality within the limitations of this arterial phase study. Review of the MIP images confirms the above findings. NON-VASCULAR Lower chest: No acute abnormality. Hepatobiliary: No focal liver abnormality. Calcified gallstone noted within the gallbladder lumen. No gallbladder wall thickening or pericholecystic fluid. No biliary dilatation. Pancreas: No focal lesion. Normal pancreatic contour. No surrounding inflammatory changes. No main pancreatic ductal dilatation. Spleen: Normal in size without focal abnormality. Adrenals/Urinary Tract: No adrenal nodule bilaterally. Bilateral kidneys enhance symmetrically. No hydronephrosis. No hydroureter. Bilateral nephrolithiasis measuring up to 3 mm on the left and 6 mm on the right. No ureterolithiasis bilaterally. The urinary bladder is unremarkable. Stomach/Bowel: Stomach is within normal limits. No evidence of bowel wall thickening or dilatation. Colonic diverticulosis. Appendix appears normal. Lymphatic: No lymphadenopathy. Reproductive: Prostate is unremarkable. Other: No intraperitoneal free fluid. No intraperitoneal free gas. No organized fluid collection. Musculoskeletal: No abdominal wall hernia or abnormality. No suspicious lytic or blastic osseous lesions. No acute displaced fracture. Multilevel degenerative changes of the spine. Grade 1 anterolisthesis of L4 on L5. Right L5-S1 pseudoarthrosis. IMPRESSION: VASCULAR 1. Markedly limited evaluation of the popliteal arteries bilaterally due to timing of contrast. 2. Discontinuous patency of the bilateral anterior tibial arteries due to atherosclerotic plaque. Otherwise posterior tibial and peroneal arteries vessel runoff to the ankle bilaterally on the delayed images. Left ankle runoff appears slightly delayed compared to the right with no flow noted to the foot. No definite abrupt  obstruction of the posterior tibial or peroneal artery. 3.  Aortic Atherosclerosis (ICD10-I70.0). NON-VASCULAR 1. Subcutaneus soft tissue edema of the left leg. 2. Cholelithiasis with no acute cholecystitis. 3. Nonobstructive bilateral nephrolithiasis. 4. Colonic diverticulosis with no acute diverticulitis. Electronically Signed   By: Morgane  Naveau M.D.   On: 11/02/2023 18:48      IMPRESSION AND PLAN:  Assessment and Plan: No notes have been filed under this hospital service. Service: Hospitalist      DVT prophylaxis: Lovenox***  Advanced Care Planning:  Code Status: full code***  Family Communication:  The plan of care was discussed in details with the patient (and family). I answered all questions. The patient agreed to proceed with the above mentioned plan. Further management will depend upon hospital course. Disposition Plan: Back to previous home environment Consults called: none***  All the records are reviewed and case discussed with ED provider.  Status is: Inpatient {Inpatient:23812}   At the time of the admission, it appears that the appropriate admission status for this patient is inpatient.  This is judged to be reasonable and necessary in order to provide the required intensity of service to ensure the patient's safety given the presenting symptoms, physical exam findings and initial radiographic and laboratory data in the context of comorbid conditions.  The patient requires inpatient status due to high intensity of service, high risk of further deterioration and high frequency of surveillance required.  I certify that at the time of admission, it is my clinical judgment that the patient will require inpatient hospital care extending more than 2 midnights.                            Dispo: The patient is from: Home              Anticipated d/c is to: Home              Patient currently is not medically stable to d/c.  Difficult to place patient: No  Madison DELENA Peaches M.D on 11/02/2023 at 8:48 PM  [facility]   From 7 PM-7 AM, contact night-coverage [email or web address]  CC: Primary care physician; Administration, [employer]

## 2023-11-02 NOTE — Consult Note (Signed)
 [facility] Vascular Consult Note  MRN : [id number]  Ryan Gates is a 68 y.o. (1956/03/01) male who presents with chief complaint of  Chief Complaint  Patient presents with   Leg Swelling  .  History of Present Illness: 24 yom remote smoker- h/o gout, psoriasis, Hyperlipidemia, HTN presents with LEFT lower extremity swelling, redness and numbness. He states it was sudden- 2 hours prior to presentation. He denied pain. He states he also developed a psoriasis flare at that time on the LEFT leg. The patient noted a few weeks ago he developed mild swelling and pain behind the LEFT knee and proximal calf that resolved with his gout medication. He presented today for evaluation- found to have an extensive LEFT lower extremity DVT and diffuse arterial lower extremity disease(poor quality CTA). Started on a Heparin  gtt. Upon my exam he states his foot and leg have improved slightly. Numbness is resolving. Denies pain.  Current Facility-Administered Medications  Medication Dose Route Frequency Provider Last Rate Last Admin   heparin  ADULT infusion 100 units/mL (25000 units/250mL)  1,500 Units/hr Intravenous Continuous Legrand Olam MATSU, RPH 15 mL/hr at 11/02/23 1935 1,500 Units/hr at 11/02/23 1935   Oral care mouth rinse  15 mL Mouth Rinse PRN Mansy, Madison LABOR, MD        Past Medical History:  Diagnosis Date   Arthritis    Asthma    Back pain 04/16/2011   Gout    Hernia 04/15/2008   Hypertension 04/16/2007   Kidney stone 04/15/1988   Obesity    Psoriasis 04/15/2006   Ulcer     Past Surgical History:  Procedure Laterality Date   HERNIA REPAIR  2010   KIDNEY STONE SURGERY  2011   TONSILLECTOMY  1964    Social History Social History   Tobacco Use   Smoking status: Former    Types: Cigarettes   Smokeless tobacco: Never  Vaping Use   Vaping status: Never Used  Substance Use Topics   Alcohol use: Not Currently   Drug use: Never    Family History History  reviewed. No pertinent family history.  Allergies  Allergen Reactions   Other     pollens     REVIEW OF SYSTEMS (Negative unless checked)  Constitutional: [] Weight loss  [] Fever  [] Chills Cardiac: [] Chest pain   [] Chest pressure   [] Palpitations   [] Shortness of breath when laying flat   [] Shortness of breath at rest   [] Shortness of breath with exertion. Vascular:  [] Pain in legs with walking   [] Pain in legs at rest   [] Pain in legs when laying flat   [] Claudication   [] Pain in feet when walking  [] Pain in feet at rest  [] Pain in feet when laying flat   [] History of DVT   [] Phlebitis   [x] Swelling in legs   [] Varicose veins   [] Non-healing ulcers Pulmonary:   [] Uses home oxygen   [] Productive cough   [] Hemoptysis   [] Wheeze  [] COPD   [x] Asthma Neurologic:  [] Dizziness  [] Blackouts   [] Seizures   [] History of stroke   [] History of TIA  [] Aphasia   [] Temporary blindness   [] Dysphagia   [] Weakness or numbness in arms   [] Weakness or numbness in legs Musculoskeletal:  [] Arthritis   [] Joint swelling   [] Joint pain   [] Low back pain Hematologic:  [] Easy bruising  [] Easy bleeding   [] Hypercoagulable state   [] Anemic  [] Hepatitis Gastrointestinal:  [] Blood in stool   [] Vomiting blood  [] Gastroesophageal reflux/heartburn   []   Difficulty swallowing. Genitourinary:  [] Chronic kidney disease   [] Difficult urination  [] Frequent urination  [] Burning with urination   [] Blood in urine Skin:  [x] Rashes   [] Ulcers   [] Wounds Psychological:  [] History of anxiety   []  History of major depression.  Physical Examination  Vitals:   11/02/23 1935 11/02/23 2000 11/02/23 2018 11/02/23 2027  BP: (!) 135/103 (!) 128/92  (!) 140/97  Pulse: 92 94  95  Resp: 20 19  18   Temp:   98.4 F (36.9 C) 97.8 F (36.6 C)  TempSrc:   Oral   SpO2: 97% 95%  98%  Weight:    98.9 kg  Height:       Body mass index is 33.15 kg/m. Gen:  WD/WN, NAD Neck: Trachea midline.  No JVD.  Pulmonary:  Good air movement, respirations  not labored, equal bilaterally.  Cardiac: RRR, normal S1, S2. Vascular:  Vessel Right Left  Radial Palpable Palpable  Ulnar    Brachial    Carotid Palpable, without bruit Palpable, without bruit  Aorta Not palpable N/A  Femoral Palpable Palpable  Popliteal    PT    DP Palpable Palpable   Gastrointestinal: soft, non-tender/non-distended. No guarding/reflex.  Musculoskeletal: M/S 5/5 throughout.  Extremities without ischemic changes.  LEFT- 2 +edema, erythema, warm, toes bilaterally equally cool, no phlegmasia, thigh and calf- soft, +motor/+sensory Neurologic: Sensation grossly intact in extremities.  Symmetrical.  Speech is fluent. Motor exam as listed above. Psychiatric: Judgment intact, Mood & affect appropriate for pt's clinical situation. Dermatologic: LEFT lower extremity psoriasis. No cellulitis or open wounds.      CBC Lab Results  Component Value Date   WBC 8.9 11/02/2023   HGB 14.0 11/02/2023   HCT 43.0 11/02/2023   MCV 93.9 11/02/2023   PLT 206 11/02/2023    BMET    Component Value Date/Time   NA 139 11/02/2023 1616   NA 141 03/10/2012 1339   K 4.1 11/02/2023 1616   K 4.4 03/10/2012 1339   CL 108 11/02/2023 1616   CL 108 (H) 03/10/2012 1339   CO2 23 11/02/2023 1616   CO2 29 03/10/2012 1339   GLUCOSE 116 (H) 11/02/2023 1616   GLUCOSE 78 03/10/2012 1339   BUN 16 11/02/2023 1616   BUN 22 (H) 03/10/2012 1339   CREATININE 1.14 11/02/2023 1616   CREATININE 1.22 03/10/2012 1339   CALCIUM  9.2 11/02/2023 1616   CALCIUM  9.4 03/10/2012 1339   GFRNONAA >60 11/02/2023 1616   GFRNONAA >60 03/10/2012 1339   GFRAA >60 03/10/2012 1339   Estimated Creatinine Clearance: 68.7 mL/min (by C-G formula based on SCr of 1.14 mg/dL).  COAG Lab Results  Component Value Date   INR 1.0 11/02/2023    Radiology US  Venous Img Lower Unilateral Left Result Date: 11/02/2023 CLINICAL DATA:  Left leg swelling and redness. EXAM: LEFT LOWER EXTREMITY VENOUS DOPPLER ULTRASOUND  TECHNIQUE: Gray-scale sonography with graded compression, as well as color Doppler and duplex ultrasound were performed to evaluate the lower extremity deep venous systems from the level of the common femoral vein and including the common femoral, femoral, profunda femoral, popliteal and calf veins including the posterior tibial, peroneal and gastrocnemius veins when visible. The superficial great saphenous vein was also interrogated. Spectral Doppler was utilized to evaluate flow at rest and with distal augmentation maneuvers in the common femoral, femoral and popliteal veins. COMPARISON:  None Available. FINDINGS: Contralateral Common Femoral Vein: Respiratory phasicity is normal and symmetric with the symptomatic side. No evidence of thrombus.  Normal compressibility. Common Femoral Vein: Evidence of nonocclusive thrombus within the LEFT common femoral vein with abnormal compressibility, respiratory phasicity and response to augmentation. Saphenofemoral Junction: Evidence of nonocclusive thrombus within the LEFT saphenofemoral junction with abnormal compressibility and flow on color Doppler imaging. Profunda Femoral Vein: Evidence of nonocclusive thrombus within the LEFT profundus femoral vein with abnormal compressibility and flow on color Doppler imaging. Femoral Vein: Evidence of occlusive thrombus within the LEFT femoral vein with abnormal compressibility, respiratory phasicity and response to augmentation. Popliteal Vein: Evidence of occlusive thrombus within the LEFT popliteal vein with abnormal compressibility, respiratory phasicity and response to augmentation. Calf Veins: Evidence of nonocclusive thrombus within the LEFT posterior tibial vein and LEFT peroneal vein with abnormal compressibility and flow on color Doppler imaging. Superficial Great Saphenous Vein: No evidence of thrombus. Normal compressibility. Venous Reflux:  None. Other Findings:  None. IMPRESSION: Evidence of occlusive and nonocclusive  thrombus throughout the LEFT lower extremity, as described above Electronically Signed   By: Suzen Dials M.D.   On: 11/02/2023 19:13   CT Angio Aortobifemoral W and/or Wo Contrast Result Date: 11/02/2023 CLINICAL DATA:  Left foot with sudden on swelling, pallor, numbness. Unable to palpate pulse. r LLE redness and swelling since 2 hours ago. Has red plaques from psoriasis to same leg. Redness and heat has extended to just above knee. Denies injury. Denies pain. Denies SOB. EXAM: CT ANGIOGRAPHY OF ABDOMINAL AORTA WITH ILIOFEMORAL RUNOFF TECHNIQUE: Multidetector CT imaging of the abdomen, pelvis and lower extremities was performed using the standard protocol during bolus administration of intravenous contrast. Multiplanar CT image reconstructions and MIPs were obtained to evaluate the vascular anatomy. RADIATION DOSE REDUCTION: This exam was performed according to the departmental dose-optimization program which includes automated exposure control, adjustment of the mA and/or kV according to patient size and/or use of iterative reconstruction technique. CONTRAST:  OMNIPAQUE  IOHEXOL  350 MG/ML SOLN COMPARISON:  None Available. FINDINGS: VASCULAR Aorta: Severe calcified and noncalcified atherosclerotic plaque. Normal caliber aorta without aneurysm, dissection, vasculitis or significant stenosis. Celiac: Patent without evidence of aneurysm, dissection, vasculitis or significant stenosis. SMA: Patent without evidence of aneurysm, dissection, vasculitis or significant stenosis. Renals: Both renal arteries are patent without evidence of aneurysm, dissection, vasculitis, fibromuscular dysplasia or significant stenosis. IMA: Patent without evidence of aneurysm, dissection, vasculitis or significant stenosis. RIGHT Lower Extremity Inflow: Moderate atherosclerotic plaque. Common, internal and external iliac arteries are patent without evidence of aneurysm, dissection, vasculitis or significant stenosis. Outflow:  Moderate atherosclerotic plaque. Markedly limited evaluation of the popliteal arteries bilaterally due to timing of contrast.Common, superficial and profunda femoral arteries are patent without evidence of aneurysm, dissection, vasculitis or significant stenosis. Runoff: Moderate atherosclerotic plaque. Discontinuous patency of the anterior tibial artery due to atherosclerotic plaque. Patent posterior tibial and peroneal vessel runoff to the ankle on the delayed images. LEFT Lower Extremity Inflow: Moderate atherosclerotic plaque. Common, internal and external iliac arteries are patent without evidence of aneurysm, dissection, vasculitis or significant stenosis. Outflow: Moderate atherosclerotic plaque. Markedly limited evaluation of the popliteal arteries bilaterally due to timing of contrast. Common, superficial and profunda femoral arteries are patent without evidence of aneurysm, dissection, vasculitis or significant stenosis. Runoff: Moderate atherosclerotic plaque. Discontinuous patency of the anterior tibial artery due to atherosclerotic plaque. Patent posterior tibial and peroneal vessel runoff to the ankle on the delayed images that appears slightly delayed compared to the right with no flow noted to the foot. Veins: No obvious venous abnormality within the limitations of this arterial phase study.  Review of the MIP images confirms the above findings. NON-VASCULAR Lower chest: No acute abnormality. Hepatobiliary: No focal liver abnormality. Calcified gallstone noted within the gallbladder lumen. No gallbladder wall thickening or pericholecystic fluid. No biliary dilatation. Pancreas: No focal lesion. Normal pancreatic contour. No surrounding inflammatory changes. No main pancreatic ductal dilatation. Spleen: Normal in size without focal abnormality. Adrenals/Urinary Tract: No adrenal nodule bilaterally. Bilateral kidneys enhance symmetrically. No hydronephrosis. No hydroureter. Bilateral nephrolithiasis  measuring up to 3 mm on the left and 6 mm on the right. No ureterolithiasis bilaterally. The urinary bladder is unremarkable. Stomach/Bowel: Stomach is within normal limits. No evidence of bowel wall thickening or dilatation. Colonic diverticulosis. Appendix appears normal. Lymphatic: No lymphadenopathy. Reproductive: Prostate is unremarkable. Other: No intraperitoneal free fluid. No intraperitoneal free gas. No organized fluid collection. Musculoskeletal: No abdominal wall hernia or abnormality. No suspicious lytic or blastic osseous lesions. No acute displaced fracture. Multilevel degenerative changes of the spine. Grade 1 anterolisthesis of L4 on L5. Right L5-S1 pseudoarthrosis. IMPRESSION: VASCULAR 1. Markedly limited evaluation of the popliteal arteries bilaterally due to timing of contrast. 2. Discontinuous patency of the bilateral anterior tibial arteries due to atherosclerotic plaque. Otherwise posterior tibial and peroneal arteries vessel runoff to the ankle bilaterally on the delayed images. Left ankle runoff appears slightly delayed compared to the right with no flow noted to the foot. No definite abrupt obstruction of the posterior tibial or peroneal artery. 3.  Aortic Atherosclerosis (ICD10-I70.0). NON-VASCULAR 1. Subcutaneus soft tissue edema of the left leg. 2. Cholelithiasis with no acute cholecystitis. 3. Nonobstructive bilateral nephrolithiasis. 4. Colonic diverticulosis with no acute diverticulitis. Electronically Signed   By: Morgane  Naveau M.D.   On: 11/02/2023 18:48      Assessment/Plan Extensive LEFT lower extremity DVT without phlegmasia Heparin  gtt ECHO Supportive care If no considerable improvement in LEFT lower extremity will consider thrombectomy mid week. Will Monitor    Tisa Curry LABOR, MD  11/02/2023 8:41 PM    This note was created with Dragon medical transcription system.  Any error is purely unintentional

## 2023-11-03 ENCOUNTER — Inpatient Hospital Stay
Admit: 2023-11-03 | Discharge: 2023-11-03 | Disposition: A | Discharge status: Home / Self Care | Attending: Family Medicine | Admitting: Family Medicine

## 2023-11-03 ENCOUNTER — Other Ambulatory Visit (HOSPITAL_COMMUNITY): Payer: Self-pay

## 2023-11-03 ENCOUNTER — Inpatient Hospital Stay

## 2023-11-03 DIAGNOSIS — J45909 Unspecified asthma, uncomplicated: Secondary | ICD-10-CM | POA: Insufficient documentation

## 2023-11-03 DIAGNOSIS — K219 Gastro-esophageal reflux disease without esophagitis: Secondary | ICD-10-CM | POA: Insufficient documentation

## 2023-11-03 DIAGNOSIS — E785 Hyperlipidemia, unspecified: Secondary | ICD-10-CM | POA: Insufficient documentation

## 2023-11-03 DIAGNOSIS — M109 Gout, unspecified: Secondary | ICD-10-CM | POA: Insufficient documentation

## 2023-11-03 DIAGNOSIS — N200 Calculus of kidney: Secondary | ICD-10-CM | POA: Insufficient documentation

## 2023-11-03 DIAGNOSIS — I82402 Acute embolism and thrombosis of unspecified deep veins of left lower extremity: Secondary | ICD-10-CM | POA: Diagnosis present

## 2023-11-03 DIAGNOSIS — L409 Psoriasis, unspecified: Secondary | ICD-10-CM | POA: Insufficient documentation

## 2023-11-03 DIAGNOSIS — I10 Essential (primary) hypertension: Secondary | ICD-10-CM

## 2023-11-03 DIAGNOSIS — I824Y2 Acute embolism and thrombosis of unspecified deep veins of left proximal lower extremity: Secondary | ICD-10-CM | POA: Diagnosis not present

## 2023-11-03 DIAGNOSIS — E669 Obesity, unspecified: Secondary | ICD-10-CM | POA: Insufficient documentation

## 2023-11-03 DIAGNOSIS — N400 Enlarged prostate without lower urinary tract symptoms: Secondary | ICD-10-CM | POA: Insufficient documentation

## 2023-11-03 LAB — CBC
HCT: 37.8 % — ABNORMAL LOW (ref 39.0–52.0)
Hemoglobin: 12.2 g/dL — ABNORMAL LOW (ref 13.0–17.0)
MCH: 30.3 pg (ref 26.0–34.0)
MCHC: 32.3 g/dL (ref 30.0–36.0)
MCV: 94 fL (ref 80.0–100.0)
Platelets: 179 K/uL (ref 150–400)
RBC: 4.02 MIL/uL — ABNORMAL LOW (ref 4.22–5.81)
RDW: 13.5 % (ref 11.5–15.5)
WBC: 10.2 K/uL (ref 4.0–10.5)
nRBC: 0 % (ref 0.0–0.2)

## 2023-11-03 LAB — ECHOCARDIOGRAM COMPLETE
AR max vel: 2.95 cm2
AV Area VTI: 2.7 cm2
AV Area mean vel: 2.54 cm2
AV Mean grad: 3 mmHg
AV Peak grad: 4.4 mmHg
Ao pk vel: 1.05 m/s
Area-P 1/2: 4.39 cm2
Height: 68 in
MV VTI: 2.8 cm2
S' Lateral: 3.1 cm
Weight: 3488.56 [oz_av]

## 2023-11-03 LAB — BASIC METABOLIC PANEL WITH GFR
Anion gap: 9 (ref 5–15)
BUN: 14 mg/dL (ref 8–23)
CO2: 24 mmol/L (ref 22–32)
Calcium: 9.1 mg/dL (ref 8.9–10.3)
Chloride: 108 mmol/L (ref 98–111)
Creatinine, Ser: 1.11 mg/dL (ref 0.61–1.24)
GFR, Estimated: >60 mL/min (ref >60)
Glucose, Bld: 89 mg/dL (ref 70–99)
Potassium: 4 mmol/L (ref 3.5–5.1)
Sodium: 141 mmol/L (ref 135–145)

## 2023-11-03 LAB — HIV ANTIBODY (ROUTINE TESTING W REFLEX): HIV Screen 4th Generation wRfx: NONREACTIVE

## 2023-11-03 LAB — HEPARIN LEVEL (UNFRACTIONATED)
Heparin Unfractionated: 0.74 [IU]/mL — ABNORMAL HIGH (ref 0.30–0.70)
Heparin Unfractionated: 0.55 [IU]/mL (ref 0.30–0.70)
Heparin Unfractionated: 0.59 [IU]/mL (ref 0.30–0.70)

## 2023-11-03 MED ORDER — GUAIFENESIN ER 600 MG PO TB12
600.0000 mg | ORAL_TABLET | Freq: Two times a day (BID) | ORAL | Status: DC
  Administered 2023-11-03 – 2023-11-04 (×2): 600 mg via ORAL
  Filled 2023-11-03 (×6): qty 1

## 2023-11-03 MED ORDER — FLUTICASONE PROPIONATE 50 MCG/ACT NA SUSP
2.0000 | Freq: Every day | NASAL | Status: DC
  Administered 2023-11-03: 2 via NASAL
  Filled 2023-11-03: qty 16

## 2023-11-03 MED ORDER — ALLOPURINOL 100 MG PO TABS
100.0000 mg | ORAL_TABLET | Freq: Two times a day (BID) | ORAL | Status: DC
  Administered 2023-11-03 – 2023-11-06 (×7): 100 mg via ORAL
  Filled 2023-11-03 (×7): qty 1

## 2023-11-03 NOTE — Progress Notes (Signed)
 PHARMACY - ANTICOAGULATION CONSULT NOTE  Pharmacy Consult for heparin  dosage adjustment  Indication: DVT  Allergies  Allergen Reactions   Other     pollens    Patient Measurements: Height: 5' 8 (172.7 cm) Weight: 98.9 kg (218 lb 0.6 oz) IBW/kg (Calculated) : 68.4 HEPARIN  DW (KG): 89.1  Vital Signs: Temp: 98.4 F (36.9 C) (07/21 0758) Temp Source: Oral (07/21 0758) BP: 127/82 (07/21 0758) Pulse Rate: 90 (07/21 0758)  Labs: Recent Labs    11/02/23 1616 11/03/23 0332 11/03/23 0954  HGB 14.0 12.2*  --   HCT 43.0 37.8*  --   PLT 206 179  --   APTT 23*  --   --   LABPROT 13.2  --   --   INR 1.0  --   --   HEPARINUNFRC  --  0.74* 0.55  CREATININE 1.14 1.11  --     Estimated Creatinine Clearance: 70.6 mL/min (by C-G formula based on SCr of 1.11 mg/dL).   Medical History: Past Medical History:  Diagnosis Date   Arthritis    Asthma    Back pain 04/16/2011   Gout    Hernia 04/15/2008   Hypertension 04/16/2007   Kidney stone 04/15/1988   Obesity    Psoriasis 04/15/2006   Ulcer     Medications:  Scheduled:   atorvastatin   40 mg Oral Daily   cholecalciferol   2,000 Units Oral Daily   hydrochlorothiazide   25 mg Oral Daily   lisinopril   20 mg Oral Daily   pantoprazole   40 mg Oral Daily   tamsulosin   0.4 mg Oral Daily   Infusions:   sodium chloride  100 mL/hr at 11/03/23 0355   heparin  1,400 Units/hr (11/03/23 0841)   PRN: acetaminophen  **OR** acetaminophen , colchicine , magnesium  hydroxide, morphine  injection, ondansetron  **OR** ondansetron  (ZOFRAN ) IV, mouth rinse, traZODone   Assessment: 68 year old male presenting with DVT of lower left extremity being managed by heparin  infusion. Notable PMH gout, hyperlipidemia, psoriasis, recurrent nephrolithiasis. Renal function stable with Scr 1.11 mg/dL (unknown baseline) and CrCl 70.6 mL/min. CBC stable with hemoglobin 12.2 g/dL and platelet count of 820 K/uL. No notable drug interactions present.   Today's heparin   level of 0.55 is therapeutic x 1. Currently receiving heparin  infusion of 1,400 units/hr, following dose reduction from 1,500 units/hr (due to heparin  level of 0.74).   Goal of Therapy:  Heparin  level 0.3-0.7 units/ml Monitor platelets by anticoagulation protocol: Yes   Plan:  Continue heparin  infusion at 1,400 units/hr. Monitor heparin  level in 8 hours (at 1800), and continue to monitor CBC daily for hemoglobin and platelet count.   Jimma Ortman [place] - PharmD Candidate  11/03/2023,10:25 AM

## 2023-11-03 NOTE — Progress Notes (Addendum)
 PROGRESS NOTE    Ryan Gates  FMW:[id number] DOB: 07/18/1955 DOA: 11/02/2023 [facility]CP[facility]     Brief Narrative:   From admission h and p  Ryan Gates is a 68 y.o. male with medical history significant for osteoarthritis, asthma, gout, hypertension, urolithiasis and psoriasis, who presented to the emergency room with acute onset of worsening left leg swelling that started tingling in the foot followed by warmth and erythema with swelling extending up to his leg.  He he did not any fever or chills.  No chest pain or palpitations.  No cough or wheezing or dyspnea.  No dysuria, oliguria or hematuria or flank pain.  No other bleeding diathesis.   Assessment & Plan:   Principal Problem:   Acute deep vein thrombosis (DVT) of left lower extremity (HCC) Active Problems:   Dyslipidemia   Gout   BPH (benign prostatic hyperplasia)   GERD without esophagitis   Psoriasis  # Acute left lower extremity DVT U/s shows extensive thrombus throughout the left leg. CTA identifies no intra-abdominal pathology. Possibly provoked as says has had several long car rides over the past few months. Thinks he is utd with cancer screenings, with recent negative colonoscopy. - continue heparin  - vascular following, tentative plan for thrombectomy on 7/23 - will check CT of chest to complete in-hospital malignancy w/u - admitting provider has ordered TTE to eval for cardio-embolic source, result pending  # Psoriasis - on enbrel, ixekizumab as outpt  # HTN Bp controlled - cont home hydrochlorothiazide , lisinopril   # BPH - cont home flomax   # Gout Says is on allopurinol , doesn't appear med rec is complete  # obesity noted   DVT prophylaxis: IV heparin  Code Status: full Family Communication: none at bedside  Level of care: Progressive Status is: Inpatient Remains inpatient appropriate because: severity of illness    Consultants:   vascular  Procedures: pending  Antimicrobials:  none    Subjective: Reports mild leg pain, otherwise feeling well  Objective: Vitals:   11/02/23 2027 11/03/23 0024 11/03/23 0412 11/03/23 0758  BP: (!) 140/97 121/83 126/87 127/82  Pulse: 95 93 86 90  Resp: 18 18 15 18   Temp: 97.8 F (36.6 C) 99.1 F (37.3 C) 98.9 F (37.2 C) 98.4 F (36.9 C)  TempSrc:   Oral Oral  SpO2: 98% 93% 96% 96%  Weight: 98.9 kg     Height:        Intake/Output Summary (Last 24 hours) at 11/03/2023 0926 Last data filed at 11/03/2023 0355 Gross per 24 hour  Intake 773.79 ml  Output --  Net 773.79 ml   Filed Weights   11/02/23 1615 11/02/23 2027  Weight: 97.5 kg 98.9 kg    Examination:  General exam: Appears calm and comfortable  Respiratory system: Clear to auscultation. Respiratory effort normal. Cardiovascular system: S1 & S2 heard, RRR. No JVD, murmurs, rubs, gallops or clicks. No pedal edema. Gastrointestinal system: Abdomen is nondistended, soft and nontender. No organomegaly or masses felt. Normal bowel sounds heard. Central nervous system: Alert and oriented. No focal neurological deficits. Extremities: left leg edema Skin: erythematous scaly psoriatic plaques scattered Psychiatry: Judgement and insight appear normal. Mood & affect appropriate.     Data Reviewed: I have personally reviewed following labs and imaging studies  CBC: Recent Labs  Lab 11/02/23 1616 11/03/23 0332  WBC 8.9 10.2  NEUTROABS 5.5  --   HGB 14.0 12.2*  HCT 43.0 37.8*  MCV 93.9 94.0  PLT 206 179  Basic Metabolic Panel: Recent Labs  Lab 11/02/23 1616 11/03/23 0332  NA 139 141  K 4.1 4.0  CL 108 108  CO2 23 24  GLUCOSE 116* 89  BUN 16 14  CREATININE 1.14 1.11  CALCIUM  9.2 9.1   GFR: Estimated Creatinine Clearance: 70.6 mL/min (by C-G formula based on SCr of 1.11 mg/dL). Liver Function Tests: Recent Labs  Lab 11/02/23 1616  AST 23  ALT 24  ALKPHOS 71  BILITOT 1.0  PROT 7.5   ALBUMIN 3.8   No results for input(s): LIPASE, AMYLASE in the last 168 hours. No results for input(s): AMMONIA in the last 168 hours. Coagulation Profile: Recent Labs  Lab 11/02/23 1616  INR 1.0   Cardiac Enzymes: No results for input(s): CKTOTAL, CKMB, CKMBINDEX, TROPONINI in the last 168 hours. BNP (last 3 results) No results for input(s): PROBNP in the last 8760 hours. HbA1C: No results for input(s): HGBA1C in the last 72 hours. CBG: No results for input(s): GLUCAP in the last 168 hours. Lipid Profile: No results for input(s): CHOL, HDL, LDLCALC, TRIG, CHOLHDL, LDLDIRECT in the last 72 hours. Thyroid Function Tests: No results for input(s): TSH, T4TOTAL, FREET4, T3FREE, THYROIDAB in the last 72 hours. Anemia Panel: No results for input(s): VITAMINB12, FOLATE, FERRITIN, TIBC, IRON, RETICCTPCT in the last 72 hours. Urine analysis:    Component Value Date/Time   APPEARANCEUR Clear 09/11/2022 1029   GLUCOSEU Negative 09/11/2022 1029   BILIRUBINUR Negative 09/11/2022 1029   PROTEINUR Negative 09/11/2022 1029   NITRITE Negative 09/11/2022 1029   LEUKOCYTESUR Negative 09/11/2022 1029   Sepsis Labs: @LABRCNTIP (procalcitonin:4,lacticidven:4)  )No results found for this or any previous visit (from the past 240 hours).       Radiology Studies: US  Venous Img Lower Unilateral Left Result Date: 11/02/2023 CLINICAL DATA:  Left leg swelling and redness. EXAM: LEFT LOWER EXTREMITY VENOUS DOPPLER ULTRASOUND TECHNIQUE: Gray-scale sonography with graded compression, as well as color Doppler and duplex ultrasound were performed to evaluate the lower extremity deep venous systems from the level of the common femoral vein and including the common femoral, femoral, profunda femoral, popliteal and calf veins including the posterior tibial, peroneal and gastrocnemius veins when visible. The superficial great saphenous vein was also  interrogated. Spectral Doppler was utilized to evaluate flow at rest and with distal augmentation maneuvers in the common femoral, femoral and popliteal veins. COMPARISON:  None Available. FINDINGS: Contralateral Common Femoral Vein: Respiratory phasicity is normal and symmetric with the symptomatic side. No evidence of thrombus. Normal compressibility. Common Femoral Vein: Evidence of nonocclusive thrombus within the LEFT common femoral vein with abnormal compressibility, respiratory phasicity and response to augmentation. Saphenofemoral Junction: Evidence of nonocclusive thrombus within the LEFT saphenofemoral junction with abnormal compressibility and flow on color Doppler imaging. Profunda Femoral Vein: Evidence of nonocclusive thrombus within the LEFT profundus femoral vein with abnormal compressibility and flow on color Doppler imaging. Femoral Vein: Evidence of occlusive thrombus within the LEFT femoral vein with abnormal compressibility, respiratory phasicity and response to augmentation. Popliteal Vein: Evidence of occlusive thrombus within the LEFT popliteal vein with abnormal compressibility, respiratory phasicity and response to augmentation. Calf Veins: Evidence of nonocclusive thrombus within the LEFT posterior tibial vein and LEFT peroneal vein with abnormal compressibility and flow on color Doppler imaging. Superficial Great Saphenous Vein: No evidence of thrombus. Normal compressibility. Venous Reflux:  None. Other Findings:  None. IMPRESSION: Evidence of occlusive and nonocclusive thrombus throughout the LEFT lower extremity, as described above Electronically Signed   By: Suzen  Houston M.D.   On: 11/02/2023 19:13   CT Angio Aortobifemoral W and/or Wo Contrast Result Date: 11/02/2023 CLINICAL DATA:  Left foot with sudden on swelling, pallor, numbness. Unable to palpate pulse. r LLE redness and swelling since 2 hours ago. Has red plaques from psoriasis to same leg. Redness and heat has  extended to just above knee. Denies injury. Denies pain. Denies SOB. EXAM: CT ANGIOGRAPHY OF ABDOMINAL AORTA WITH ILIOFEMORAL RUNOFF TECHNIQUE: Multidetector CT imaging of the abdomen, pelvis and lower extremities was performed using the standard protocol during bolus administration of intravenous contrast. Multiplanar CT image reconstructions and MIPs were obtained to evaluate the vascular anatomy. RADIATION DOSE REDUCTION: This exam was performed according to the departmental dose-optimization program which includes automated exposure control, adjustment of the mA and/or kV according to patient size and/or use of iterative reconstruction technique. CONTRAST:  OMNIPAQUE  IOHEXOL  350 MG/ML SOLN COMPARISON:  None Available. FINDINGS: VASCULAR Aorta: Severe calcified and noncalcified atherosclerotic plaque. Normal caliber aorta without aneurysm, dissection, vasculitis or significant stenosis. Celiac: Patent without evidence of aneurysm, dissection, vasculitis or significant stenosis. SMA: Patent without evidence of aneurysm, dissection, vasculitis or significant stenosis. Renals: Both renal arteries are patent without evidence of aneurysm, dissection, vasculitis, fibromuscular dysplasia or significant stenosis. IMA: Patent without evidence of aneurysm, dissection, vasculitis or significant stenosis. RIGHT Lower Extremity Inflow: Moderate atherosclerotic plaque. Common, internal and external iliac arteries are patent without evidence of aneurysm, dissection, vasculitis or significant stenosis. Outflow: Moderate atherosclerotic plaque. Markedly limited evaluation of the popliteal arteries bilaterally due to timing of contrast.Common, superficial and profunda femoral arteries are patent without evidence of aneurysm, dissection, vasculitis or significant stenosis. Runoff: Moderate atherosclerotic plaque. Discontinuous patency of the anterior tibial artery due to atherosclerotic plaque. Patent posterior tibial and  peroneal vessel runoff to the ankle on the delayed images. LEFT Lower Extremity Inflow: Moderate atherosclerotic plaque. Common, internal and external iliac arteries are patent without evidence of aneurysm, dissection, vasculitis or significant stenosis. Outflow: Moderate atherosclerotic plaque. Markedly limited evaluation of the popliteal arteries bilaterally due to timing of contrast. Common, superficial and profunda femoral arteries are patent without evidence of aneurysm, dissection, vasculitis or significant stenosis. Runoff: Moderate atherosclerotic plaque. Discontinuous patency of the anterior tibial artery due to atherosclerotic plaque. Patent posterior tibial and peroneal vessel runoff to the ankle on the delayed images that appears slightly delayed compared to the right with no flow noted to the foot. Veins: No obvious venous abnormality within the limitations of this arterial phase study. Review of the MIP images confirms the above findings. NON-VASCULAR Lower chest: No acute abnormality. Hepatobiliary: No focal liver abnormality. Calcified gallstone noted within the gallbladder lumen. No gallbladder wall thickening or pericholecystic fluid. No biliary dilatation. Pancreas: No focal lesion. Normal pancreatic contour. No surrounding inflammatory changes. No main pancreatic ductal dilatation. Spleen: Normal in size without focal abnormality. Adrenals/Urinary Tract: No adrenal nodule bilaterally. Bilateral kidneys enhance symmetrically. No hydronephrosis. No hydroureter. Bilateral nephrolithiasis measuring up to 3 mm on the left and 6 mm on the right. No ureterolithiasis bilaterally. The urinary bladder is unremarkable. Stomach/Bowel: Stomach is within normal limits. No evidence of bowel wall thickening or dilatation. Colonic diverticulosis. Appendix appears normal. Lymphatic: No lymphadenopathy. Reproductive: Prostate is unremarkable. Other: No intraperitoneal free fluid. No intraperitoneal free gas. No  organized fluid collection. Musculoskeletal: No abdominal wall hernia or abnormality. No suspicious lytic or blastic osseous lesions. No acute displaced fracture. Multilevel degenerative changes of the spine. Grade 1 anterolisthesis of L4 on L5. Right L5-S1  pseudoarthrosis. IMPRESSION: VASCULAR 1. Markedly limited evaluation of the popliteal arteries bilaterally due to timing of contrast. 2. Discontinuous patency of the bilateral anterior tibial arteries due to atherosclerotic plaque. Otherwise posterior tibial and peroneal arteries vessel runoff to the ankle bilaterally on the delayed images. Left ankle runoff appears slightly delayed compared to the right with no flow noted to the foot. No definite abrupt obstruction of the posterior tibial or peroneal artery. 3.  Aortic Atherosclerosis (ICD10-I70.0). NON-VASCULAR 1. Subcutaneus soft tissue edema of the left leg. 2. Cholelithiasis with no acute cholecystitis. 3. Nonobstructive bilateral nephrolithiasis. 4. Colonic diverticulosis with no acute diverticulitis. Electronically Signed   By: Morgane  Naveau M.D.   On: 11/02/2023 18:48        Scheduled Meds:  atorvastatin   40 mg Oral Daily   cholecalciferol   2,000 Units Oral Daily   hydrochlorothiazide   25 mg Oral Daily   lisinopril   20 mg Oral Daily   pantoprazole   40 mg Oral Daily   tamsulosin   0.4 mg Oral Daily   Continuous Infusions:  sodium chloride  100 mL/hr at 11/03/23 0355   heparin  1,400 Units/hr (11/03/23 0841)     LOS: 1 day     Devaughn KATHEE Ban, MD [facility]   If 7PM-7AM, please contact night-coverage [email or web address] Password [email or web address] 11/03/2023, 9:26 AM

## 2023-11-03 NOTE — Assessment & Plan Note (Addendum)
-   The patient gets outpatient injection of Tremfya. - Will continue Kenalog  ointment.

## 2023-11-03 NOTE — Progress Notes (Signed)
 Progress Note    11/03/2023 12:22 PM * No surgery found *  Subjective:  Ryan Gates is a 68 yo male with a significant history of smoking, gout, psoriasis, Hyperlipidemia, HTN presents to [facility] Emergency room on 11/02/23 with LEFT lower extremity swelling, redness and numbness. He states it was sudden- 2 hours prior to presentation. He denied pain. He states he also developed a psoriasis flare at that time on the LEFT leg. The patient noted a few weeks ago he developed mild swelling and pain behind the LEFT knee and proximal calf that resolved with his gout medication. He presented today for evaluation- found to have an extensive LEFT lower extremity DVT and diffuse arterial lower extremity disease(poor quality CTA). Started on a Heparin  gtt.   On exam today patient right lower extremity with +2 -+3 edema from his groin down through his foot. He endorses pain but tolerable and pain is worse on ambulation. Extremities are warm to touch and pulses are palpable. Vascular Surgery consulted to evaluate.   Vitals:   11/03/23 0758 11/03/23 1155  BP: 127/82 124/82  Pulse: 90 98  Resp: 18 18  Temp: 98.4 F (36.9 C) 98.2 F (36.8 C)  SpO2: 96% 96%   Physical Exam: Cardiac:  RRR, normal S1-S2 no murmurs. Lungs: Clear throughout on auscultation, no rales rhonchi or wheezing noted. Incisions: None Extremities: Bilateral lower extremities are warm to touch this morning palpable pulses.  Right lower extremity with +2 to +3 edema and swelling.  Painful upon ambulation. Abdomen: Positive bowel sounds throughout, soft, nontender and nondistended. Neurologic: Alert and oriented x 4, answers all questions and follows commands appropriately.  CBC    Component Value Date/Time   WBC 10.2 11/03/2023 0332   RBC 4.02 (L) 11/03/2023 0332   HGB 12.2 (L) 11/03/2023 0332   HGB 14.9 03/10/2012 1339   HCT 37.8 (L) 11/03/2023 0332   HCT 44.1 03/10/2012 1339   PLT 179 11/03/2023 0332   PLT 291 03/10/2012 1339    MCV 94.0 11/03/2023 0332   MCV 93 03/10/2012 1339   MCH 30.3 11/03/2023 0332   MCHC 32.3 11/03/2023 0332   RDW 13.5 11/03/2023 0332   RDW 13.4 03/10/2012 1339   LYMPHSABS 2.1 11/02/2023 1616   LYMPHSABS 1.7 03/10/2012 1339   MONOABS 0.9 11/02/2023 1616   MONOABS 0.8 03/10/2012 1339   EOSABS 0.4 11/02/2023 1616   EOSABS 0.4 03/10/2012 1339   BASOSABS 0.0 11/02/2023 1616   BASOSABS 0.1 03/10/2012 1339    BMET    Component Value Date/Time   NA 141 11/03/2023 0332   NA 141 03/10/2012 1339   K 4.0 11/03/2023 0332   K 4.4 03/10/2012 1339   CL 108 11/03/2023 0332   CL 108 (H) 03/10/2012 1339   CO2 24 11/03/2023 0332   CO2 29 03/10/2012 1339   GLUCOSE 89 11/03/2023 0332   GLUCOSE 78 03/10/2012 1339   BUN 14 11/03/2023 0332   BUN 22 (H) 03/10/2012 1339   CREATININE 1.11 11/03/2023 0332   CREATININE 1.22 03/10/2012 1339   CALCIUM  9.1 11/03/2023 0332   CALCIUM  9.4 03/10/2012 1339   GFRNONAA >60 11/03/2023 0332   GFRNONAA >60 03/10/2012 1339   GFRAA >60 03/10/2012 1339    INR    Component Value Date/Time   INR 1.0 11/02/2023 1616     Intake/Output Summary (Last 24 hours) at 11/03/2023 1222 Last data filed at 11/03/2023 0900 Gross per 24 hour  Intake 1253.79 ml  Output --  Net 1253.79  ml     Assessment/Plan:  68 y.o. male who presents to [facility] emergency department with left lower extremity swelling, redness and numbness that started 2 hours prior to his presentation to the ER.  He notes that he started with a psoriasis flare of the left leg at the same time.  He also endorses 2 weeks prior to having developed mild swelling around his knee with some pain to the proximal calf that resolved after he took some gout medication.  Patient was started on heparin  infusion in the emergency department.  He underwent DVT studies which showed occlusive DVT from his left femoral vein down through his entire leg.* No surgery found *   PLAN Vascular surgery plans on taking the patient  to the vascular lab on 11/05/2023 for left lower extremity venous thrombectomy.  I discussed in detail at the bedside this morning with the patient the procedure, benefits, risk, complications.  Patient verbalized understanding wishes to proceed as soon as possible.  I answered all his questions this morning.  Patient will be made n.p.o. after midnight the day of his procedure.  Patient will be on a heparin  infusion until that time.  Patient's hemoglobin and hematocrit are within normal range as well as the patient's BUN and creatinine.  DVT prophylaxis: Heparin  infusion.   Gwendlyn JONELLE Shank [facility] 11/03/2023 12:22 PM

## 2023-11-03 NOTE — Progress Notes (Signed)
 PHARMACY - ANTICOAGULATION CONSULT NOTE  Pharmacy Consult for Heparin  Infusion Indication: DVT  Allergies  Allergen Reactions   Other     pollens    Patient Measurements: Height: 5' 8 (172.7 cm) Weight: 98.9 kg (218 lb 0.6 oz) IBW/kg (Calculated) : 68.4 HEPARIN  DW (KG): 89.1  Vital Signs: Temp: 98.9 F (37.2 C) (07/21 0412) Temp Source: Oral (07/21 0412) BP: 126/87 (07/21 0412) Pulse Rate: 86 (07/21 0412)  Labs: Recent Labs    11/02/23 1616 11/03/23 0332  HGB 14.0 12.2*  HCT 43.0 37.8*  PLT 206 179  APTT 23*  --   LABPROT 13.2  --   INR 1.0  --   HEPARINUNFRC  --  0.74*  CREATININE 1.14  --     Estimated Creatinine Clearance: 68.7 mL/min (by C-G formula based on SCr of 1.14 mg/dL).   Medical History: Past Medical History:  Diagnosis Date   Arthritis    Asthma    Back pain 04/16/2011   Gout    Hernia 04/15/2008   Hypertension 04/16/2007   Kidney stone 04/15/1988   Obesity    Psoriasis 04/15/2006   Ulcer    Assessment: Patient is a 68yo male with redness and swelling of left lower extremity. Ultrasound shows occlusive and non-occlusive DVT. Pharmacy consulted for Heparin  dosing. No prior anticoagulants noted in history, baseline labs within range.  Goal of Therapy:  Heparin  level 0.3-0.7 units/ml Monitor platelets by anticoagulation protocol: Yes   Plan:  7/21:  HL @ 0332 = 0.74, elevated - Will decrease heparin  drip rate to 1400 units/hr and recheck HL 6 hrs after rate change - CBC daily   Allison Silva D 11/03/2023 4:16 AM

## 2023-11-03 NOTE — Assessment & Plan Note (Signed)
 Will continue statin therapy

## 2023-11-03 NOTE — Progress Notes (Signed)
 PHARMACY - ANTICOAGULATION CONSULT NOTE  Pharmacy Consult for heparin  dosage adjustment  Indication: DVT  Patient Measurements: Height: 5' 8 (172.7 cm) Weight: 98.9 kg (218 lb 0.6 oz) IBW/kg (Calculated) : 68.4 HEPARIN  DW (KG): 89.1  Labs: Recent Labs    11/02/23 1616 11/03/23 0332 11/03/23 0954 11/03/23 1815  HGB 14.0 12.2*  --   --   HCT 43.0 37.8*  --   --   PLT 206 179  --   --   APTT 23*  --   --   --   LABPROT 13.2  --   --   --   INR 1.0  --   --   --   HEPARINUNFRC  --  0.74* 0.55 0.59  CREATININE 1.14 1.11  --   --     Estimated Creatinine Clearance: 70.6 mL/min (by C-G formula based on SCr of 1.11 mg/dL).   Medical History: Past Medical History:  Diagnosis Date   Arthritis    Asthma    Back pain 04/16/2011   Gout    Hernia 04/15/2008   Hypertension 04/16/2007   Kidney stone 04/15/1988   Obesity    Psoriasis 04/15/2006   Ulcer    Assessment: 68 year old male presenting with DVT of lower left extremity being managed by heparin  infusion. Notable PMH gout, hyperlipidemia, psoriasis, recurrent nephrolithiasis. Renal function stable with Scr 1.11 mg/dL (unknown baseline) and CrCl 70.6 mL/min. CBC stable with hemoglobin 12.2 g/dL and platelet count of 820 K/uL. No notable drug interactions present.   Goal of Therapy:  Heparin  level 0.3-0.7 units/ml Monitor platelets by anticoagulation protocol: Yes   Plan:  --Heparin  level is therapeutic x 2 --Continue heparin  infusion at 1400 units/hr --HL & CBC tomorrow AM  Ryan Gates 11/03/2023,7:38 PM

## 2023-11-03 NOTE — Assessment & Plan Note (Signed)
-   Will continue allopurinol  and colchicine .

## 2023-11-03 NOTE — Assessment & Plan Note (Signed)
 -  We will continue Flomax

## 2023-11-03 NOTE — Assessment & Plan Note (Addendum)
-   The patient be admitted to a progressive unit bed. - Will continue on IV heparin . - Vascular surgery consult by Dr. Tisa is appreciated. - He may need thrombectomy by midweek if he has no improvement.  Dr. Gwyndolyn. - Pain management will be provided. - Given the rapid onset we will need to rule out embolic source. - We will therefore obtain a 2D echo.

## 2023-11-03 NOTE — Plan of Care (Signed)
   Problem: Health Behavior/Discharge Planning: Goal: Ability to manage health-related needs will improve Outcome: Progressing   Problem: Clinical Measurements: Goal: Will remain free from infection Outcome: Progressing   Problem: Clinical Measurements: Goal: Respiratory complications will improve Outcome: Progressing

## 2023-11-03 NOTE — Assessment & Plan Note (Signed)
 Will continue PPI therapy.

## 2023-11-03 NOTE — Progress Notes (Signed)
*  PRELIMINARY RESULTS* Echocardiogram 2D Echocardiogram has been performed.  Ryan Gates 11/03/2023, 9:25 AM

## 2023-11-04 DIAGNOSIS — I824Y2 Acute embolism and thrombosis of unspecified deep veins of left proximal lower extremity: Secondary | ICD-10-CM | POA: Diagnosis not present

## 2023-11-04 LAB — CBC
HCT: 37 % — ABNORMAL LOW (ref 39.0–52.0)
Hemoglobin: 12.1 g/dL — ABNORMAL LOW (ref 13.0–17.0)
MCH: 31 pg (ref 26.0–34.0)
MCHC: 32.7 g/dL (ref 30.0–36.0)
MCV: 94.9 fL (ref 80.0–100.0)
Platelets: 168 K/uL (ref 150–400)
RBC: 3.9 MIL/uL — ABNORMAL LOW (ref 4.22–5.81)
RDW: 13.5 % (ref 11.5–15.5)
WBC: 9.3 K/uL (ref 4.0–10.5)
nRBC: 0 % (ref 0.0–0.2)

## 2023-11-04 LAB — BASIC METABOLIC PANEL WITH GFR
Anion gap: 8 (ref 5–15)
BUN: 19 mg/dL (ref 8–23)
CO2: 27 mmol/L (ref 22–32)
Calcium: 9.3 mg/dL (ref 8.9–10.3)
Chloride: 105 mmol/L (ref 98–111)
Creatinine, Ser: 1.26 mg/dL — ABNORMAL HIGH (ref 0.61–1.24)
GFR, Estimated: >60 mL/min (ref >60)
Glucose, Bld: 102 mg/dL — ABNORMAL HIGH (ref 70–99)
Potassium: 4.6 mmol/L (ref 3.5–5.1)
Sodium: 140 mmol/L (ref 135–145)

## 2023-11-04 LAB — HEPARIN LEVEL (UNFRACTIONATED): Heparin Unfractionated: 0.58 [IU]/mL (ref 0.30–0.70)

## 2023-11-04 MED ORDER — FLUTICASONE PROPIONATE 50 MCG/ACT NA SUSP
2.0000 | Freq: Two times a day (BID) | NASAL | Status: DC | PRN
  Administered 2023-11-04: 2 via NASAL

## 2023-11-04 MED ORDER — MELATONIN 3 MG PO TABS: 3.0000 mg | ORAL_TABLET | Freq: Every day | ORAL | Status: DC

## 2023-11-04 MED ORDER — MELATONIN 5 MG PO TABS
2.5000 mg | ORAL_TABLET | Freq: Every evening | ORAL | Status: DC | PRN
  Administered 2023-11-04: 2.5 mg via ORAL
  Filled 2023-11-04: qty 1

## 2023-11-04 MED ORDER — KETOROLAC TROMETHAMINE 15 MG/ML IJ SOLN: 15.0000 mg | Freq: Four times a day (QID) | INTRAMUSCULAR | Status: DC | PRN

## 2023-11-04 NOTE — Progress Notes (Signed)
 PHARMACY - ANTICOAGULATION CONSULT NOTE  Pharmacy Consult for heparin  dosage adjustment  Indication: DVT  Patient Measurements: Height: 5' 8 (172.7 cm) Weight: 98.9 kg (218 lb 0.6 oz) IBW/kg (Calculated) : 68.4 HEPARIN  DW (KG): 89.1  Labs: Recent Labs    11/02/23 1616 11/02/23 1616 11/03/23 0332 11/03/23 0954 11/03/23 1815 11/04/23 0609  HGB 14.0  --  12.2*  --   --  12.1*  HCT 43.0  --  37.8*  --   --  37.0*  PLT 206  --  179  --   --  168  APTT 23*  --   --   --   --   --   LABPROT 13.2  --   --   --   --   --   INR 1.0  --   --   --   --   --   HEPARINUNFRC  --    < > 0.74* 0.55 0.59 0.58  CREATININE 1.14  --  1.11  --   --   --    < > = values in this interval not displayed.    Estimated Creatinine Clearance: 70.6 mL/min (by C-G formula based on SCr of 1.11 mg/dL).   Medical History: Past Medical History:  Diagnosis Date   Arthritis    Asthma    Back pain 04/16/2011   Gout    Hernia 04/15/2008   Hypertension 04/16/2007   Kidney stone 04/15/1988   Obesity    Psoriasis 04/15/2006   Ulcer    Assessment: 68 year old male presenting with DVT of lower left extremity being managed by heparin  infusion. Notable PMH gout, hyperlipidemia, psoriasis, recurrent nephrolithiasis. Renal function stable with Scr 1.11 mg/dL (unknown baseline) and CrCl 70.6 mL/min. CBC stable with hemoglobin 12.2 g/dL and platelet count of 820 K/uL. No notable drug interactions present.   Goal of Therapy:  Heparin  level 0.3-0.7 units/ml Monitor platelets by anticoagulation protocol: Yes   Plan:  --Heparin  level is therapeutic x 3 --Continue heparin  infusion at 1400 units/hr --HL & CBC tomorrow AM  Rankin CANDIE Dills, PharmD, [facility] 11/04/2023 6:45 AM

## 2023-11-04 NOTE — Progress Notes (Signed)
 PROGRESS NOTE    [name]  FMW:[id number]  DOB: 30-Jul-1955  DOA: 11/02/2023 PCP: [facility]:   Hospital course:   68 year old man with HTN, gout and ongoing tobacco use presents with LLE swelling and redness.  Workup revealed extensive left lower extremity DVT and diffuse arterial lower extremity disease.  Patient was started on heparin  drip.  Patient was seen by vascular surgery who are planning a thrombectomy on 723  Subjective:  Patient without any complaints, notes leg pain is reasonably controlled.  Was hoping to hold his heparin  while he took a shower, I noted that he needed to stay on his heparin  and could not shower today.   Objective: Vitals:   11/04/23 0330 11/04/23 0813 11/04/23 1100 11/04/23 1651  BP: 121/85 101/75 92/73 120/86  Pulse: 89 86 100 97  Resp: 18 17 17    Temp: (!) 97.5 F (36.4 C) 98.8 F (37.1 C) 98.3 F (36.8 C) 98.3 F (36.8 C)  TempSrc:      SpO2: 94% 94% 94% 93%  Weight:      Height:        Intake/Output Summary (Last 24 hours) at 11/04/2023 1821 Last data filed at 11/04/2023 1300 Gross per 24 hour  Intake 2676.07 ml  Output --  Net 2676.07 ml   Filed Weights   11/02/23 1615 11/02/23 2027  Weight: 97.5 kg 98.9 kg     Exam:  General: Comfortable appearing man in good spirits lying in bed in NAD Eyes: sclera anicteric, conjuctiva mild injection bilaterally CVS: S1-S2, regular  Respiratory:  decreased air entry bilaterally secondary to decreased inspiratory effort, rales at bases  GI: NABS, soft, NT  LE: RLE with 2-3+ edema Neuro: A/O x 3,  grossly nonfocal.  Psych: patient is logical and coherent, judgement and insight appear normal, mood and affect appropriate to situation.  Data Reviewed:  Basic Metabolic Panel: Recent Labs  Lab 11/02/23 1616 11/03/23 0332 11/04/23 0609  NA 139 141 140  K 4.1 4.0 4.6  CL 108 108 105  CO2 23 24 27   GLUCOSE 116* 89 102*  BUN 16 14 19    CREATININE 1.14 1.11 1.26*  CALCIUM  9.2 9.1 9.3    CBC: Recent Labs  Lab 11/02/23 1616 11/03/23 0332 11/04/23 0609  WBC 8.9 10.2 9.3  NEUTROABS 5.5  --   --   HGB 14.0 12.2* 12.1*  HCT 43.0 37.8* 37.0*  MCV 93.9 94.0 94.9  PLT 206 179 168     Scheduled Meds:  allopurinol   100 mg Oral BID   atorvastatin   40 mg Oral Daily   cholecalciferol   2,000 Units Oral Daily   guaiFENesin   600 mg Oral BID   lisinopril   20 mg Oral Daily   pantoprazole   40 mg Oral Daily   tamsulosin   0.4 mg Oral Daily   Continuous Infusions:  heparin  1,400 Units/hr (11/04/23 0305)     Assessment & Plan:   Acute extensive LLE DVT On heparin  drip Plan for thrombectomy 7/23 per vascular surgery Unclear if provoked or not provoked, patient has had several long car rides over the past few months, thinks he is up-to-date with cancer screenings with recent negative colonoscopy. CT of chest and CTA of abdomen done here is without evidence of malignancy  HTN Continue present meds  Psoriasis Continue present meds  BPH Continue Flomax   Gout Continue allopurinol     DVT prophylaxis: IV heparin  Code Status: Full Family Communication: None today     Studies:  CT CHEST WO CONTRAST Result Date: 11/03/2023 CLINICAL DATA:  Deep venous thrombosis.  Evaluate for malignancy. EXAM: CT CHEST WITHOUT CONTRAST TECHNIQUE: Multidetector CT imaging of the chest was performed following the standard protocol without IV contrast. RADIATION DOSE REDUCTION: This exam was performed according to the departmental dose-optimization program which includes automated exposure control, adjustment of the mA and/or kV according to patient size and/or use of iterative reconstruction technique. COMPARISON:  None Available. FINDINGS: Cardiovascular: Aortic atherosclerosis. Normal heart size, without pericardial effusion. Left main and 3 vessel coronary artery calcification. Aortic valve calcification. Mediastinum/Nodes: No  mediastinal or hilar adenopathy, given limitations of unenhanced CT. Lungs/Pleura: No pleural fluid.  Clear lungs. Upper Abdomen: Dependent gallstone. Normal imaged portions of the liver, spleen, stomach, pancreas, adrenal glands. Musculoskeletal: No acute osseous abnormality. IMPRESSION: 1. No acute process or evidence of active malignancy within the chest. 2. Aortic valvular calcifications. Consider echocardiography to evaluate for valvular dysfunction. 3. Coronary artery atherosclerosis. Aortic Atherosclerosis (ICD10-I70.0). 4. Cholelithiasis Electronically Signed   By: Rockey Kilts M.D.   On: 11/03/2023 11:10   ECHOCARDIOGRAM COMPLETE Result Date: 11/03/2023    ECHOCARDIOGRAM REPORT   Patient Name:   Ryan Gates Date of Exam: 11/03/2023 Medical Rec #:  [id number]    Height:       68.0 in Accession #:    [id number]   Weight:       218.0 lb Date of Birth:  October 09, 1955     BSA:          2.120 m Patient Age:    68 years     BP:           126/87 mmHg Patient Gender: M            HR:           86 bpm. Exam Location:  [facility] Procedure: 2D Echo, Color Doppler and Cardiac Doppler (Both Spectral and Color            Flow Doppler were utilized during procedure). Indications:     DVT ( Deep venous thrombosis)  History:         Patient has no prior history of Echocardiogram examinations.                  Risk Factors:Hypertension.  Sonographer:     Christopher Furnace Referring Phys:  [id number] JAN A MANSY Diagnosing Phys: Lonni Hanson MD IMPRESSIONS  1. Left ventricular ejection fraction, by estimation, is 55 to 60%. The left ventricle has normal function. Left ventricular endocardial border not optimally defined to evaluate regional wall motion. There is moderate left ventricular hypertrophy. Left ventricular diastolic parameters are indeterminate.  2. Right ventricular systolic function is low normal. The right ventricular size is normal.  3. The mitral valve is normal in structure. No evidence of mitral valve regurgitation. No  evidence of mitral stenosis.  4. The aortic valve was not well visualized. There is mild calcification of the aortic valve. Aortic valve regurgitation is not visualized. No aortic stenosis is present. FINDINGS  Left Ventricle: Left ventricular ejection fraction, by estimation, is 55 to 60%. The left ventricle has normal function. Left ventricular endocardial border not optimally defined to evaluate regional wall motion. The left ventricular internal cavity size was normal in size. There is moderate left ventricular hypertrophy. Left ventricular diastolic parameters are indeterminate. Right Ventricle: The right ventricular size is normal. No increase in right ventricular wall thickness. Right ventricular systolic function is low normal. Left Atrium: Left  atrial size was normal in size. Right Atrium: Right atrial size was normal in size. Pericardium: Trivial pericardial effusion is present. Mitral Valve: The mitral valve is normal in structure. No evidence of mitral valve regurgitation. No evidence of mitral valve stenosis. MV peak gradient, 3.4 mmHg. The mean mitral valve gradient is 2.0 mmHg. Tricuspid Valve: The tricuspid valve is not well visualized. Tricuspid valve regurgitation is trivial. Aortic Valve: The aortic valve was not well visualized. There is mild calcification of the aortic valve. Aortic valve regurgitation is not visualized. No aortic stenosis is present. Aortic valve mean gradient measures 3.0 mmHg. Aortic valve peak gradient  measures 4.4 mmHg. Aortic valve area, by VTI measures 2.70 cm. Pulmonic Valve: The pulmonic valve was not well visualized. Pulmonic valve regurgitation is not visualized. No evidence of pulmonic stenosis. Aorta: The aortic root is normal in size and structure. Pulmonary Artery: The pulmonary artery is not well seen. Venous: The inferior vena cava was not well visualized. IAS/Shunts: The interatrial septum was not well visualized.  LEFT VENTRICLE PLAX 2D LVIDd:         4.50  cm   Diastology LVIDs:         3.10 cm   LV e' medial:    8.59 cm/s LV PW:         1.51 cm   LV E/e' medial:  10.7 LV IVS:        1.20 cm   LV e' lateral:   5.66 cm/s LVOT diam:     2.20 cm   LV E/e' lateral: 16.3 LV SV:         52 LV SV Index:   25 LVOT Area:     3.80 cm  RIGHT VENTRICLE RV Basal diam:  3.30 cm RV Mid diam:    3.20 cm RV S prime:     11.00 cm/s TAPSE (M-mode): 1.7 cm LEFT ATRIUM             Index        RIGHT ATRIUM           Index LA diam:        3.80 cm 1.79 cm/m   RA Area:     15.20 cm LA Vol (A2C):   48.2 ml 22.73 ml/m  RA Volume:   31.30 ml  14.76 ml/m LA Vol (A4C):   27.5 ml 12.97 ml/m LA Biplane Vol: 36.6 ml 17.26 ml/m  AORTIC VALVE AV Area (Vmax):    2.95 cm AV Area (Vmean):   2.54 cm AV Area (VTI):     2.70 cm AV Vmax:           105.00 cm/s AV Vmean:          75.300 cm/s AV VTI:            0.194 m AV Peak Grad:      4.4 mmHg AV Mean Grad:      3.0 mmHg LVOT Vmax:         81.40 cm/s LVOT Vmean:        50.400 cm/s LVOT VTI:          0.138 m LVOT/AV VTI ratio: 0.71  AORTA Ao Root diam: 3.50 cm MITRAL VALVE MV Area (PHT): 4.39 cm    SHUNTS MV Area VTI:   2.80 cm    Systemic VTI:  0.14 m MV Peak grad:  3.4 mmHg    Systemic Diam: 2.20 cm MV Mean grad:  2.0 mmHg MV  Vmax:       0.92 m/s MV Vmean:      67.0 cm/s MV Decel Time: 173 msec MV E velocity: 92.20 cm/s MV A velocity: 74.90 cm/s MV E/A ratio:  1.23 Lonni Hanson MD Electronically signed by Lonni Hanson MD Signature Date/Time: 11/03/2023/10:17:33 AM    Final    US  Venous Img Lower Unilateral Left Result Date: 11/02/2023 CLINICAL DATA:  Left leg swelling and redness. EXAM: LEFT LOWER EXTREMITY VENOUS DOPPLER ULTRASOUND TECHNIQUE: Gray-scale sonography with graded compression, as well as color Doppler and duplex ultrasound were performed to evaluate the lower extremity deep venous systems from the level of the common femoral vein and including the common femoral, femoral, profunda femoral, popliteal and calf veins including  the posterior tibial, peroneal and gastrocnemius veins when visible. The superficial great saphenous vein was also interrogated. Spectral Doppler was utilized to evaluate flow at rest and with distal augmentation maneuvers in the common femoral, femoral and popliteal veins. COMPARISON:  None Available. FINDINGS: Contralateral Common Femoral Vein: Respiratory phasicity is normal and symmetric with the symptomatic side. No evidence of thrombus. Normal compressibility. Common Femoral Vein: Evidence of nonocclusive thrombus within the LEFT common femoral vein with abnormal compressibility, respiratory phasicity and response to augmentation. Saphenofemoral Junction: Evidence of nonocclusive thrombus within the LEFT saphenofemoral junction with abnormal compressibility and flow on color Doppler imaging. Profunda Femoral Vein: Evidence of nonocclusive thrombus within the LEFT profundus femoral vein with abnormal compressibility and flow on color Doppler imaging. Femoral Vein: Evidence of occlusive thrombus within the LEFT femoral vein with abnormal compressibility, respiratory phasicity and response to augmentation. Popliteal Vein: Evidence of occlusive thrombus within the LEFT popliteal vein with abnormal compressibility, respiratory phasicity and response to augmentation. Calf Veins: Evidence of nonocclusive thrombus within the LEFT posterior tibial vein and LEFT peroneal vein with abnormal compressibility and flow on color Doppler imaging. Superficial Great Saphenous Vein: No evidence of thrombus. Normal compressibility. Venous Reflux:  None. Other Findings:  None. 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   On: 11/02/2023 19:13    Principal Problem:   Acute deep vein thrombosis (DVT) of left lower extremity (HCC) Active Problems:   Dyslipidemia   Gout   BPH (benign prostatic  hyperplasia)   GERD without esophagitis   Psoriasis     Traeton Bordas Tublu Eithel Ryall, [facility]  If 7PM-7AM, please contact night-coverage [email or web address]   LOS: 2 days

## 2023-11-04 NOTE — Progress Notes (Signed)
 Progress Note    11/04/2023 10:30 AM * No surgery found *  Subjective:  Ryan Gates is a 68 yo male with a significant history of smoking, gout, psoriasis, Hyperlipidemia, HTN presents to [facility] Emergency room on 11/02/23 with LEFT lower extremity swelling, redness and numbness. He states it was sudden- 2 hours prior to presentation. He denied pain. He states he also developed a psoriasis flare at that time on the LEFT leg. The patient noted a few weeks ago he developed mild swelling and pain behind the LEFT knee and proximal calf that resolved with his gout medication. He presented today for evaluation- found to have an extensive LEFT lower extremity DVT and diffuse arterial lower extremity disease(poor quality CTA). Started on a Heparin  gtt.    On exam today, after being on heparin  for 48 hours, the patient's right lower extremity remains with +2 -+3 edema from his groin down through his foot. He endorses pain but tolerable and pain is worse on ambulation. Extremities are warm to touch and pulses are palpable.    Vitals:   11/04/23 0330 11/04/23 0813  BP: 121/85 101/75  Pulse: 89 86  Resp: 18 17  Temp: (!) 97.5 F (36.4 C) 98.8 F (37.1 C)  SpO2: 94% 94%   Physical Exam: Cardiac:  RRR, normal S1-S2 no murmurs. Lungs: Clear throughout on auscultation, no rales rhonchi or wheezing noted. Incisions: None Extremities: Bilateral lower extremities are warm to touch this morning palpable pulses.  Right lower extremity with +2 to +3 edema and swelling.  Painful upon ambulation. Abdomen: Positive bowel sounds throughout, soft, nontender and nondistended. Neurologic: Alert and oriented x 4, answers all questions and follows commands appropriately.  CBC    Component Value Date/Time   WBC 9.3 11/04/2023 0609   RBC 3.90 (L) 11/04/2023 0609   HGB 12.1 (L) 11/04/2023 0609   HGB 14.9 03/10/2012 1339   HCT 37.0 (L) 11/04/2023 0609   HCT 44.1 03/10/2012 1339   PLT 168 11/04/2023 0609   PLT  291 03/10/2012 1339   MCV 94.9 11/04/2023 0609   MCV 93 03/10/2012 1339   MCH 31.0 11/04/2023 0609   MCHC 32.7 11/04/2023 0609   RDW 13.5 11/04/2023 0609   RDW 13.4 03/10/2012 1339   LYMPHSABS 2.1 11/02/2023 1616   LYMPHSABS 1.7 03/10/2012 1339   MONOABS 0.9 11/02/2023 1616   MONOABS 0.8 03/10/2012 1339   EOSABS 0.4 11/02/2023 1616   EOSABS 0.4 03/10/2012 1339   BASOSABS 0.0 11/02/2023 1616   BASOSABS 0.1 03/10/2012 1339    BMET    Component Value Date/Time   NA 140 11/04/2023 0609   NA 141 03/10/2012 1339   K 4.6 11/04/2023 0609   K 4.4 03/10/2012 1339   CL 105 11/04/2023 0609   CL 108 (H) 03/10/2012 1339   CO2 27 11/04/2023 0609   CO2 29 03/10/2012 1339   GLUCOSE 102 (H) 11/04/2023 0609   GLUCOSE 78 03/10/2012 1339   BUN 19 11/04/2023 0609   BUN 22 (H) 03/10/2012 1339   CREATININE 1.26 (H) 11/04/2023 0609   CREATININE 1.22 03/10/2012 1339   CALCIUM  9.3 11/04/2023 0609   CALCIUM  9.4 03/10/2012 1339   GFRNONAA >60 11/04/2023 0609   GFRNONAA >60 03/10/2012 1339   GFRAA >60 03/10/2012 1339    INR    Component Value Date/Time   INR 1.0 11/02/2023 1616     Intake/Output Summary (Last 24 hours) at 11/04/2023 1030 Last data filed at 11/03/2023 2353 Gross per 24 hour  Intake 2825.66 ml  Output --  Net 2825.66 ml     Assessment/Plan:  68 y.o. male who presents to [facility] emergency department with left lower extremity swelling, redness and numbness that started 2 hours prior to his presentation to the ER. He notes that he started with a psoriasis flare of the left leg at the same time. He also endorses 2 weeks prior to having developed mild swelling around his knee with some pain to the proximal calf that resolved after he took some gout medication. Patient was started on heparin  infusion in the emergency department. He underwent DVT studies which showed occlusive DVT from his left femoral vein down through his entire leg.  * No surgery found *   Plan  Vascular  Surgery will proceed with thrombectomy tomorrow as planned.   DVT prophylaxis:  Heparin  Infusion    Gwendlyn JONELLE Shank [facility] 11/04/2023 10:30 AM

## 2023-11-04 NOTE — Care Management Important Message (Signed)
 Important Message  Patient Details  Name: Ryan Gates MRN: [id number] Date of Birth: 05-11-1955   Important Message Given:  Yes - Medicare IM     Ryan Gates 11/04/2023, 1:41 PM

## 2023-11-04 NOTE — H&P (View-Only) (Signed)
 Progress Note    11/04/2023 10:30 AM * No surgery found *  Subjective:  Ryan Gates is a 68 yo male with a significant history of smoking, gout, psoriasis, Hyperlipidemia, HTN presents to [facility] Emergency room on 11/02/23 with LEFT lower extremity swelling, redness and numbness. He states it was sudden- 2 hours prior to presentation. He denied pain. He states he also developed a psoriasis flare at that time on the LEFT leg. The patient noted a few weeks ago he developed mild swelling and pain behind the LEFT knee and proximal calf that resolved with his gout medication. He presented today for evaluation- found to have an extensive LEFT lower extremity DVT and diffuse arterial lower extremity disease(poor quality CTA). Started on a Heparin  gtt.    On exam today, after being on heparin  for 48 hours, the patient's right lower extremity remains with +2 -+3 edema from his groin down through his foot. He endorses pain but tolerable and pain is worse on ambulation. Extremities are warm to touch and pulses are palpable.    Vitals:   11/04/23 0330 11/04/23 0813  BP: 121/85 101/75  Pulse: 89 86  Resp: 18 17  Temp: (!) 97.5 F (36.4 C) 98.8 F (37.1 C)  SpO2: 94% 94%   Physical Exam: Cardiac:  RRR, normal S1-S2 no murmurs. Lungs: Clear throughout on auscultation, no rales rhonchi or wheezing noted. Incisions: None Extremities: Bilateral lower extremities are warm to touch this morning palpable pulses.  Right lower extremity with +2 to +3 edema and swelling.  Painful upon ambulation. Abdomen: Positive bowel sounds throughout, soft, nontender and nondistended. Neurologic: Alert and oriented x 4, answers all questions and follows commands appropriately.  CBC    Component Value Date/Time   WBC 9.3 11/04/2023 0609   RBC 3.90 (L) 11/04/2023 0609   HGB 12.1 (L) 11/04/2023 0609   HGB 14.9 03/10/2012 1339   HCT 37.0 (L) 11/04/2023 0609   HCT 44.1 03/10/2012 1339   PLT 168 11/04/2023 0609   PLT  291 03/10/2012 1339   MCV 94.9 11/04/2023 0609   MCV 93 03/10/2012 1339   MCH 31.0 11/04/2023 0609   MCHC 32.7 11/04/2023 0609   RDW 13.5 11/04/2023 0609   RDW 13.4 03/10/2012 1339   LYMPHSABS 2.1 11/02/2023 1616   LYMPHSABS 1.7 03/10/2012 1339   MONOABS 0.9 11/02/2023 1616   MONOABS 0.8 03/10/2012 1339   EOSABS 0.4 11/02/2023 1616   EOSABS 0.4 03/10/2012 1339   BASOSABS 0.0 11/02/2023 1616   BASOSABS 0.1 03/10/2012 1339    BMET    Component Value Date/Time   NA 140 11/04/2023 0609   NA 141 03/10/2012 1339   K 4.6 11/04/2023 0609   K 4.4 03/10/2012 1339   CL 105 11/04/2023 0609   CL 108 (H) 03/10/2012 1339   CO2 27 11/04/2023 0609   CO2 29 03/10/2012 1339   GLUCOSE 102 (H) 11/04/2023 0609   GLUCOSE 78 03/10/2012 1339   BUN 19 11/04/2023 0609   BUN 22 (H) 03/10/2012 1339   CREATININE 1.26 (H) 11/04/2023 0609   CREATININE 1.22 03/10/2012 1339   CALCIUM  9.3 11/04/2023 0609   CALCIUM  9.4 03/10/2012 1339   GFRNONAA >60 11/04/2023 0609   GFRNONAA >60 03/10/2012 1339   GFRAA >60 03/10/2012 1339    INR    Component Value Date/Time   INR 1.0 11/02/2023 1616     Intake/Output Summary (Last 24 hours) at 11/04/2023 1030 Last data filed at 11/03/2023 2353 Gross per 24 hour  Intake 2825.66 ml  Output --  Net 2825.66 ml     Assessment/Plan:  68 y.o. male who presents to [facility] emergency department with left lower extremity swelling, redness and numbness that started 2 hours prior to his presentation to the ER. He notes that he started with a psoriasis flare of the left leg at the same time. He also endorses 2 weeks prior to having developed mild swelling around his knee with some pain to the proximal calf that resolved after he took some gout medication. Patient was started on heparin  infusion in the emergency department. He underwent DVT studies which showed occlusive DVT from his left femoral vein down through his entire leg.  * No surgery found *   Plan  Vascular  Surgery will proceed with thrombectomy tomorrow as planned.   DVT prophylaxis:  Heparin  Infusion    Gwendlyn JONELLE Shank [facility] 11/04/2023 10:30 AM

## 2023-11-05 ENCOUNTER — Encounter: Payer: Self-pay | Admitting: Vascular Surgery

## 2023-11-05 ENCOUNTER — Encounter
Admission: EM | Disposition: A | Discharge status: Home / Self Care | Payer: Self-pay | Source: Home / Self Care | Attending: Internal Medicine

## 2023-11-05 ENCOUNTER — Inpatient Hospital Stay: Admitting: Certified Registered"

## 2023-11-05 DIAGNOSIS — I871 Compression of vein: Secondary | ICD-10-CM

## 2023-11-05 DIAGNOSIS — L409 Psoriasis, unspecified: Secondary | ICD-10-CM | POA: Diagnosis not present

## 2023-11-05 DIAGNOSIS — I82432 Acute embolism and thrombosis of left popliteal vein: Secondary | ICD-10-CM | POA: Diagnosis not present

## 2023-11-05 DIAGNOSIS — K219 Gastro-esophageal reflux disease without esophagitis: Secondary | ICD-10-CM | POA: Diagnosis not present

## 2023-11-05 DIAGNOSIS — I82412 Acute embolism and thrombosis of left femoral vein: Secondary | ICD-10-CM

## 2023-11-05 DIAGNOSIS — I824Y2 Acute embolism and thrombosis of unspecified deep veins of left proximal lower extremity: Secondary | ICD-10-CM | POA: Diagnosis not present

## 2023-11-05 DIAGNOSIS — I82422 Acute embolism and thrombosis of left iliac vein: Principal | ICD-10-CM

## 2023-11-05 HISTORY — PX: PERIPHERAL VASCULAR THROMBECTOMY: CATH118306

## 2023-11-05 LAB — CBC
HCT: 37.1 % — ABNORMAL LOW (ref 39.0–52.0)
Hemoglobin: 12 g/dL — ABNORMAL LOW (ref 13.0–17.0)
MCH: 30.4 pg (ref 26.0–34.0)
MCHC: 32.3 g/dL (ref 30.0–36.0)
MCV: 93.9 fL (ref 80.0–100.0)
Platelets: 183 K/uL (ref 150–400)
RBC: 3.95 MIL/uL — ABNORMAL LOW (ref 4.22–5.81)
RDW: 13.3 % (ref 11.5–15.5)
WBC: 9.8 K/uL (ref 4.0–10.5)
nRBC: 0 % (ref 0.0–0.2)

## 2023-11-05 LAB — HEPARIN LEVEL (UNFRACTIONATED): Heparin Unfractionated: 0.68 [IU]/mL (ref 0.30–0.70)

## 2023-11-05 SURGERY — PERIPHERAL VASCULAR THROMBECTOMY
Anesthesia: Moderate Sedation | Laterality: Left

## 2023-11-05 MED ORDER — CEFAZOLIN SODIUM-DEXTROSE 2-4 GM/100ML-% IV SOLN
INTRAVENOUS | Status: AC
  Filled 2023-11-05: qty 100

## 2023-11-05 MED ORDER — SODIUM CHLORIDE 0.9 % IV SOLN: INTRAVENOUS | Status: DC

## 2023-11-05 MED ORDER — METHYLPREDNISOLONE SODIUM SUCC 125 MG IJ SOLR: 125.0000 mg | Freq: Once | INTRAMUSCULAR | Status: DC | PRN

## 2023-11-05 MED ORDER — HEPARIN SODIUM (PORCINE) 1000 UNIT/ML IJ SOLN
INTRAMUSCULAR | Status: DC | PRN
  Administered 2023-11-05: 3000 [IU] via INTRAVENOUS

## 2023-11-05 MED ORDER — MIDAZOLAM HCL 2 MG/2ML IJ SOLN
INTRAMUSCULAR | Status: AC
  Filled 2023-11-05: qty 2

## 2023-11-05 MED ORDER — FENTANYL CITRATE PF 50 MCG/ML IJ SOSY
PREFILLED_SYRINGE | INTRAMUSCULAR | Status: AC
Start: 2023-11-05 — End: 2023-11-05
  Filled 2023-11-05: qty 1

## 2023-11-05 MED ORDER — MIDAZOLAM HCL 2 MG/ML PO SYRP: 8.0000 mg | ORAL_SOLUTION | Freq: Once | ORAL | Status: DC | PRN

## 2023-11-05 MED ORDER — CEFAZOLIN SODIUM-DEXTROSE 2-4 GM/100ML-% IV SOLN
2.0000 g | INTRAVENOUS | Status: AC
  Administered 2023-11-05: 2 g via INTRAVENOUS
  Filled 2023-11-05: qty 100

## 2023-11-05 MED ORDER — HEPARIN (PORCINE) IN NACL 1000-0.9 UT/500ML-% IV SOLN
INTRAVENOUS | Status: DC | PRN
  Administered 2023-11-05: 1000 mL

## 2023-11-05 MED ORDER — DIPHENHYDRAMINE HCL 50 MG/ML IJ SOLN: 50.0000 mg | Freq: Once | INTRAMUSCULAR | Status: DC | PRN

## 2023-11-05 MED ORDER — MIDAZOLAM HCL 2 MG/2ML IJ SOLN
INTRAMUSCULAR | Status: DC | PRN
  Administered 2023-11-05 (×2): 1 mg via INTRAVENOUS
  Administered 2023-11-05: 2 mg via INTRAVENOUS

## 2023-11-05 MED ORDER — HEPARIN SODIUM (PORCINE) 1000 UNIT/ML IJ SOLN
INTRAMUSCULAR | Status: AC
Start: 2023-11-05 — End: 2023-11-05
  Filled 2023-11-05: qty 10

## 2023-11-05 MED ORDER — IODIXANOL 320 MG/ML IV SOLN
INTRAVENOUS | Status: DC | PRN
  Administered 2023-11-05: 40 mL

## 2023-11-05 MED ORDER — FENTANYL CITRATE PF 50 MCG/ML IJ SOSY
PREFILLED_SYRINGE | INTRAMUSCULAR | Status: AC
  Filled 2023-11-05: qty 1

## 2023-11-05 MED ORDER — FENTANYL CITRATE (PF) 100 MCG/2ML IJ SOLN
INTRAMUSCULAR | Status: DC | PRN
  Administered 2023-11-05: 50 ug via INTRAVENOUS
  Administered 2023-11-05 (×2): 25 ug via INTRAVENOUS

## 2023-11-05 MED ORDER — LIDOCAINE-EPINEPHRINE (PF) 1 %-1:200000 IJ SOLN
INTRAMUSCULAR | Status: DC | PRN
  Administered 2023-11-05: 10 mL

## 2023-11-05 MED ORDER — FAMOTIDINE 20 MG PO TABS: 40.0000 mg | ORAL_TABLET | Freq: Once | ORAL | Status: DC | PRN

## 2023-11-05 SURGICAL SUPPLY — 15 items
BALLOON ARMADA 35LL 6X250X135 (BALLOONS) IMPLANT
BALLOON ATG 12X6X80 (BALLOONS) IMPLANT
BALLOON ATG 14X6X80 (BALLOONS) IMPLANT
CANISTER PENUMBRA ENGINE (MISCELLANEOUS) IMPLANT
CATH LIGHTNI FLASH 16XTORQ 100 (CATHETERS) IMPLANT
CLOSURE PERCLOSE PROSTYLE (VASCULAR PRODUCTS) IMPLANT
COVER PROBE ULTRASOUND 5X96 (MISCELLANEOUS) IMPLANT
DEVICE PRESTO INFLATION (MISCELLANEOUS) IMPLANT
GLIDEWIRE ADV .035X260CM (WIRE) IMPLANT
KIT MICROPUNCTURE VSI 5F STIFF (SHEATH) IMPLANT
PACK ANGIOGRAPHY (CUSTOM PROCEDURE TRAY) ×1 IMPLANT
SHEATH BRITE TIP 6FRX11 (SHEATH) IMPLANT
SHEATH INTRO CHECKFLO 16F 13 (SHEATH) IMPLANT
STENT VENOVO 16X100X80 (Permanent Stent) IMPLANT
WIRE J 3MM .035X145CM (WIRE) IMPLANT

## 2023-11-05 NOTE — Progress Notes (Signed)
  Progress Note   Patient: Ryan Gates FMW:[id number] DOB: 1956-02-04 DOA: 11/02/2023     3 DOS: the patient was seen and examined on 11/05/2023   Brief hospital course: 68 year old man with HTN, gout and ongoing tobacco use presents with LLE swelling and redness.  Workup revealed extensive left lower extremity DVT and diffuse arterial lower extremity disease.  Patient was started on heparin  drip.  thrombectomy on 7/23     Principal Problem:   Acute deep vein thrombosis (DVT) of left lower extremity (HCC) Active Problems:   Dyslipidemia   Gout   BPH (benign prostatic hyperplasia)   GERD without esophagitis   Psoriasis   Assessment and Plan: * Acute deep vein thrombosis (DVT) of left lower extremity (HCC) status post thrombectomy. Patient just had a thrombectomy today, doing well.  Has some swelling in the legs.  Continue heparin  drip, will transition to Eliquis  tomorrow at discharge.  Psoriasis - The patient gets outpatient injection of Tremfya. - Will continue Kenalog  ointment. Follow-up with PCP as outpatient.  GERD without esophagitis - Will continue PPI therapy.  BPH (benign prostatic hyperplasia) - We will continue Flomax .  Gout - Will continue allopurinol  and colchicine .  Dyslipidemia - Will continue statin therapy.       Subjective:  Patient complaining of left leg swelling, no significant pain  Physical Exam: Vitals:   11/05/23 0930 11/05/23 0945 11/05/23 1013 11/05/23 1616  BP: 102/75 104/78 105/71 113/79  Pulse: 93 91 91 90  Resp: 18 18    Temp:  (!) 97 F (36.1 C) 98.1 F (36.7 C) 98 F (36.7 C)  TempSrc:  Temporal Oral   SpO2: 95% 94% 96% 97%  Weight:      Height:       General exam: Appears calm and comfortable  Respiratory system: Clear to auscultation. Respiratory effort normal. Cardiovascular system: S1 & S2 heard, RRR. No JVD, murmurs, rubs, gallops or clicks. No pedal edema. Gastrointestinal system: Abdomen is nondistended, soft and  nontender. No organomegaly or masses felt. Normal bowel sounds heard. Central nervous system: Alert and oriented. No focal neurological deficits. Extremities: Symmetric 5 x 5 power. Skin: No rashes, lesions or ulcers Psychiatry: Judgement and insight appear normal. Mood & affect appropriate.    Data Reviewed:  Reviewed CT scan results and lab results  Family Communication: None  Disposition: Status is: Inpatient Remains inpatient appropriate because: Viral disease, IV treatment, postprocedure.     Time spent: 35 minutes  Author: Murvin Mana, MD 11/05/2023 4:42 PM  For on call review [email or web address].

## 2023-11-05 NOTE — Hospital Course (Signed)
 68 year old man with HTN, gout and ongoing tobacco use presents with LLE swelling and redness.  Workup revealed extensive left lower extremity DVT and diffuse arterial lower extremity disease.  Patient was started on heparin  drip.  thrombectomy on 7/23

## 2023-11-05 NOTE — Progress Notes (Signed)
 Transition of Care [facility]) - Inpatient Brief Assessment   Patient Details  Name: Ryan Gates MRN: [id number] Date of Birth: 1956-01-13  Transition of Care [facility]) CM/SW Contact:    Burrel Legrand C Catalaya Garr, RN Phone Number: 11/05/2023, 2:25 PM   Clinical Narrative: TOC continuing to follow patient's progress throughout discharge planning.   Transition of Care Asessment: Insurance and Status: Insurance coverage has been reviewed Patient has primary care physician: Yes   Prior level of function:: Independent Prior/Current Home Services: No current home services Social Drivers of Health Review: SDOH reviewed no interventions necessary Readmission risk has been reviewed: Yes Transition of care needs: no transition of care needs at this time

## 2023-11-05 NOTE — Plan of Care (Signed)
   Problem: Education: Goal: Knowledge of General Education information will improve Description: Including pain rating scale, medication(s)/side effects and non-pharmacologic comfort measures Outcome: Progressing   Problem: Clinical Measurements: Goal: Ability to maintain clinical measurements within normal limits will improve Outcome: Progressing   Problem: Clinical Measurements: Goal: Diagnostic test results will improve Outcome: Progressing   Problem: Clinical Measurements: Goal: Respiratory complications will improve Outcome: Progressing

## 2023-11-05 NOTE — Interval H&P Note (Signed)
 History and Physical Interval Note:  11/05/2023 8:15 AM  Ryan Gates  has presented today for surgery, with the diagnosis of Left Lower Extremity Occlusive DVT TIbial to proximal Femoral.  The various methods of treatment have been discussed with the patient and family. After consideration of risks, benefits and other options for treatment, the patient has consented to  Procedure(s): PERIPHERAL VASCULAR THROMBECTOMY (Left) as a surgical intervention.  The patient's history has been reviewed, patient examined, no change in status, stable for surgery.  I have reviewed the patient's chart and labs.  Questions were answered to the patient's satisfaction.     [name]

## 2023-11-05 NOTE — Plan of Care (Signed)
  Problem: Education: Goal: Knowledge of General Education information will improve Description: Including pain rating scale, medication(s)/side effects and non-pharmacologic comfort measures Outcome: Progressing   Problem: Clinical Measurements: Goal: Respiratory complications will improve Outcome: Progressing   Problem: Clinical Measurements: Goal: Cardiovascular complication will be avoided Outcome: Progressing   Problem: Activity: Goal: Risk for activity intolerance will decrease Outcome: Progressing   Problem: Pain Managment: Goal: General experience of comfort will improve and/or be controlled Outcome: Progressing   Problem: Safety: Goal: Ability to remain free from injury will improve Outcome: Progressing

## 2023-11-05 NOTE — Op Note (Signed)
 [name]   OPERATIVE NOTE   PRE-OPERATIVE DIAGNOSIS: extensive LLE DVT  POST-OPERATIVE DIAGNOSIS: same   PROCEDURE: 1.   US  guidance for vascular access to left popliteal vein 2.   Catheter placement into left external iliac vein from left popliteal approach 3.   IVC gram and left lower extremity venogram 4.   Mechanical thrombectomy to left popliteal vein, superficial femoral vein, common femoral vein, and external iliac vein with the penumbra 16 flash device 5.   PTA of the left superficial femoral vein and common femoral vein with 6 mm diameter by 25 cm length angioplasty balloon 6.   PTA of left external iliac vein with 12 mm balloon 7.   Stent placement to the left external iliac vein with 16 mm diameter by 10 cm length Venovo stent    SURGEON: Selinda Gu, MD  ASSISTANT(S): none  ANESTHESIA: local with moderate conscious sedation for 37 minutes using 4 mg of Versed  and 100 mcg of Fentanyl   ESTIMATED BLOOD LOSS: 200 cc  FINDING(S): 1.  Extensive thrombosis throughout the left lower extremity with a tight stenosis in the mid superficial femoral vein of greater than 80% as well as a high-grade stenosis in the left external iliac vein of greater than 80%.  The common iliac vein and inferior vena cava were patent without thrombus or stenosis  SPECIMEN(S):  none  INDICATIONS:    Patient is a 68 y.o. male who presents with extensive left lower extremity DVT.  Patient has marked leg swelling and pain.  Venous intervention is performed to reduce the symtpoms and avoid long term postphlebitic symptoms.    DESCRIPTION: After obtaining full informed written consent, the patient was brought back to the vascular suite and placed supine upon the table. Moderate conscious sedation was administered during a face to face encounter with the patient throughout the procedure with my supervision of the RN administering medicines and monitoring the patient's vital signs,  pulse oximetry, telemetry and mental status throughout from the start of the procedure until the patient was taken to the recovery room.  After obtaining adequate anesthesia, the patient was prepped and draped in the standard fashion.    The patient was then placed into the prone position.  The left popliteal vein was then accessed under direct ultrasound guidance without difficulty with a micropuncture needle and a permanent image was recorded.  I then upsized to an 6 Fr sheath over a J wire.  A ProGlide device was then placed in a preclose fashion and we upsized to a 16 [place] sheath.  3000 units of heparin  were then given.  Imaging showed extensive DVT with minimal flow.  A Kumpe catheter and Advantage wire were then advanced into the CFV and external iliac vein and images were performed.  Extensive thrombosis throughout the left lower extremity with a tight stenosis in the mid superficial femoral vein of greater than 80% as well as a high-grade stenosis in the left external iliac vein of greater than 80%.  The common iliac vein and inferior vena cava were patent without thrombus or stenosis.  I used the Penumbra Cat 16 French flash catheter and evacuated about 50 to 75 cc of effluent with mechanical thrombectomy throughout the SFV, and popliteal vein but this would not cross into the common femoral vein due to the stenosis in the superficial femoral vein.  I then performed balloon angioplasty of the superficial femoral vein and common femoral vein with a 6 mm diameter by  25 cm length angioplasty balloon inflated to 12 atm for 1 minute.  I then was able to advance the 16 French flash catheter up into the more proximal superficial femoral vein, common femoral vein, and external iliac vein and perform extensive mechanical thrombectomy.  Extensive amounts of thrombus were then removed and there was only a small amount of residual thrombus in the proximal superficial femoral vein and common femoral vein.  The iliac  vein thrombus was resolved, but the high-grade stenosis in the external iliac vein remained.  I then turned my attention to the iliac veins.  The stenosis/occlusion was treated with a 12 mm diameter angioplasty balloon.  There still remained greater than 50% stenosis after angioplasty of the left external iliac vein, so I elected to stent the area.  A 16 mm diameter by 10 cm length  Venovo stent was then placed in the left external iliac vein and postdilated with a 14 mm balloon with excellent angiograph completion result and no significant residual stenosis.  I then elected to terminate the procedure.  The sheath was removed, the Pro-glide device was secured, and a dressing was placed.  He was taken to the recovery room in stable condition having tolerated the procedure well.    COMPLICATIONS: None  CONDITION: Stable  Ryan Gates 11/05/2023 9:00 AM

## 2023-11-05 NOTE — Progress Notes (Signed)
 PHARMACY - ANTICOAGULATION CONSULT NOTE  Pharmacy Consult for heparin  dosage adjustment  Indication: DVT  Patient Measurements: Height: 5' 8 (172.7 cm) Weight: 98.9 kg (218 lb 0.6 oz) IBW/kg (Calculated) : 68.4 HEPARIN  DW (KG): 89.1  Labs: Recent Labs    11/02/23 1616 11/03/23 0332 11/03/23 0954 11/03/23 1815 11/04/23 0609 11/05/23 0224  HGB 14.0 12.2*  --   --  12.1* 12.0*  HCT 43.0 37.8*  --   --  37.0* 37.1*  PLT 206 179  --   --  168 183  APTT 23*  --   --   --   --   --   LABPROT 13.2  --   --   --   --   --   INR 1.0  --   --   --   --   --   HEPARINUNFRC  --  0.74*   < > 0.59 0.58 0.68  CREATININE 1.14 1.11  --   --  1.26*  --    < > = values in this interval not displayed.    Estimated Creatinine Clearance: 62.2 mL/min (A) (by C-G formula based on SCr of 1.26 mg/dL (H)).   Medical History: Past Medical History:  Diagnosis Date   Arthritis    Asthma    Back pain 04/16/2011   Gout    Hernia 04/15/2008   Hypertension 04/16/2007   Kidney stone 04/15/1988   Obesity    Psoriasis 04/15/2006   Ulcer    Assessment: 68 year old male presenting with DVT of lower left extremity being managed by heparin  infusion. Notable PMH gout, hyperlipidemia, psoriasis, recurrent nephrolithiasis. Renal function stable with Scr 1.11 mg/dL (unknown baseline) and CrCl 70.6 mL/min. CBC stable with hemoglobin 12.2 g/dL and platelet count of 820 K/uL. No notable drug interactions present.   Goal of Therapy:  Heparin  level 0.3-0.7 units/ml Monitor platelets by anticoagulation protocol: Yes   Plan:  --Heparin  level is therapeutic x 4 --Continue heparin  infusion at 1400 units/hr --HL & CBC tomorrow AM  Rankin CANDIE Dills, PharmD, [facility] 11/05/2023 5:57 AM

## 2023-11-06 ENCOUNTER — Other Ambulatory Visit: Payer: Self-pay

## 2023-11-06 DIAGNOSIS — L409 Psoriasis, unspecified: Secondary | ICD-10-CM | POA: Diagnosis not present

## 2023-11-06 DIAGNOSIS — K219 Gastro-esophageal reflux disease without esophagitis: Secondary | ICD-10-CM | POA: Diagnosis not present

## 2023-11-06 DIAGNOSIS — I82492 Acute embolism and thrombosis of other specified deep vein of left lower extremity: Secondary | ICD-10-CM

## 2023-11-06 LAB — BASIC METABOLIC PANEL WITH GFR
Anion gap: 6 (ref 5–15)
BUN: 17 mg/dL (ref 8–23)
CO2: 28 mmol/L (ref 22–32)
Calcium: 9.2 mg/dL (ref 8.9–10.3)
Chloride: 107 mmol/L (ref 98–111)
Creatinine, Ser: 1.23 mg/dL (ref 0.61–1.24)
GFR, Estimated: >60 mL/min (ref >60)
Glucose, Bld: 98 mg/dL (ref 70–99)
Potassium: 4 mmol/L (ref 3.5–5.1)
Sodium: 141 mmol/L (ref 135–145)

## 2023-11-06 LAB — CBC
HCT: 35.2 % — ABNORMAL LOW (ref 39.0–52.0)
Hemoglobin: 11.7 g/dL — ABNORMAL LOW (ref 13.0–17.0)
MCH: 31.5 pg (ref 26.0–34.0)
MCHC: 33.2 g/dL (ref 30.0–36.0)
MCV: 94.9 fL (ref 80.0–100.0)
Platelets: 186 K/uL (ref 150–400)
RBC: 3.71 MIL/uL — ABNORMAL LOW (ref 4.22–5.81)
RDW: 13.6 % (ref 11.5–15.5)
WBC: 9.6 K/uL (ref 4.0–10.5)
nRBC: 0 % (ref 0.0–0.2)

## 2023-11-06 LAB — HEPARIN LEVEL (UNFRACTIONATED): Heparin Unfractionated: 0.56 [IU]/mL (ref 0.30–0.70)

## 2023-11-06 MED ORDER — APIXABAN (ELIQUIS) VTE STARTER PACK (10MG AND 5MG)
ORAL_TABLET | ORAL | 0 refills | Status: AC
  Filled 2023-11-06: qty 74, 30d supply, fill #0

## 2023-11-06 MED ORDER — TRIAMCINOLONE ACETONIDE 0.1 % EX OINT
TOPICAL_OINTMENT | Freq: Two times a day (BID) | CUTANEOUS | 0 refills | Status: AC
Start: 2023-11-06 — End: ?
  Filled 2023-11-06: qty 30, 15d supply, fill #0

## 2023-11-06 MED ORDER — APIXABAN 5 MG PO TABS
10.0000 mg | ORAL_TABLET | Freq: Two times a day (BID) | ORAL | Status: DC
  Administered 2023-11-06: 10 mg via ORAL
  Filled 2023-11-06: qty 2

## 2023-11-06 MED ORDER — APIXABAN 5 MG PO TABS: 5.0000 mg | ORAL_TABLET | Freq: Two times a day (BID) | ORAL | Status: DC

## 2023-11-06 NOTE — Discharge Instructions (Signed)
 Vascular Surgery Discharge Instructions:  Do not lift anything heavy for the next week.   Do not drive while taking pain medications  You may remove leg wrap tomorrow and shower  Follow up with Vein and V[facility] surgery as scheduled

## 2023-11-06 NOTE — Plan of Care (Signed)
 A&Ox4. Voiding. No c/o pain. Pt resting  Problem: Education: Goal: Knowledge of General Education information will improve Description: Including pain rating scale, medication(s)/side effects and non-pharmacologic comfort measures Outcome: Progressing   Problem: Health Behavior/Discharge Planning: Goal: Ability to manage health-related needs will improve Outcome: Progressing   Problem: Clinical Measurements: Goal: Ability to maintain clinical measurements within normal limits will improve Outcome: Progressing Goal: Will remain free from infection Outcome: Progressing Goal: Diagnostic test results will improve Outcome: Progressing Goal: Respiratory complications will improve Outcome: Progressing Goal: Cardiovascular complication will be avoided Outcome: Progressing   Problem: Activity: Goal: Risk for activity intolerance will decrease Outcome: Progressing   Problem: Nutrition: Goal: Adequate nutrition will be maintained Outcome: Progressing   Problem: Coping: Goal: Level of anxiety will decrease Outcome: Progressing   Problem: Elimination: Goal: Will not experience complications related to bowel motility Outcome: Progressing Goal: Will not experience complications related to urinary retention Outcome: Progressing   Problem: Pain Managment: Goal: General experience of comfort will improve and/or be controlled Outcome: Progressing   Problem: Safety: Goal: Ability to remain free from injury will improve Outcome: Progressing   Problem: Skin Integrity: Goal: Risk for impaired skin integrity will decrease Outcome: Progressing

## 2023-11-06 NOTE — Plan of Care (Signed)
 Discharging home with self-care.   Problem: Education: Goal: Knowledge of General Education information will improve Description: Including pain rating scale, medication(s)/side effects and non-pharmacologic comfort measures Outcome: Adequate for Discharge   Problem: Health Behavior/Discharge Planning: Goal: Ability to manage health-related needs will improve Outcome: Adequate for Discharge   Problem: Clinical Measurements: Goal: Ability to maintain clinical measurements within normal limits will improve Outcome: Adequate for Discharge Goal: Will remain free from infection Outcome: Adequate for Discharge Goal: Diagnostic test results will improve Outcome: Adequate for Discharge Goal: Respiratory complications will improve Outcome: Adequate for Discharge Goal: Cardiovascular complication will be avoided Outcome: Adequate for Discharge   Problem: Activity: Goal: Risk for activity intolerance will decrease Outcome: Adequate for Discharge   Problem: Nutrition: Goal: Adequate nutrition will be maintained Outcome: Adequate for Discharge   Problem: Coping: Goal: Level of anxiety will decrease Outcome: Adequate for Discharge   Problem: Elimination: Goal: Will not experience complications related to bowel motility Outcome: Adequate for Discharge Goal: Will not experience complications related to urinary retention Outcome: Adequate for Discharge   Problem: Pain Managment: Goal: General experience of comfort will improve and/or be controlled Outcome: Adequate for Discharge   Problem: Safety: Goal: Ability to remain free from injury will improve Outcome: Adequate for Discharge   Problem: Skin Integrity: Goal: Risk for impaired skin integrity will decrease Outcome: Adequate for Discharge

## 2023-11-06 NOTE — Discharge Summary (Signed)
 Physician Discharge Summary   Patient: Ryan Gates MRN: [id number] DOB: 1955-05-20  Admit date:     11/02/2023  Discharge date: 11/06/23  Discharge Physician: Murvin Mana   PCP: [facility]   Recommendations at discharge:   PCP in 1 week. Follow-up with vascular surgery in 6 weeks.  Discharge Diagnoses: Principal Problem:   Acute deep vein thrombosis (DVT) of left lower extremity (HCC) Active Problems:   Dyslipidemia   Gout   BPH (benign prostatic hyperplasia)   GERD without esophagitis   Psoriasis Chronic kidney disease stage II. Resolved Problems:   * No resolved hospital problems. *  Hospital Course: 68 year old man with HTN, gout and ongoing tobacco use presents with LLE swelling and redness.  Workup revealed extensive left lower extremity DVT and diffuse arterial lower extremity disease.  Patient was started on heparin  drip.  thrombectomy on 7/23   Condition improved postop, anticoagulation changed to Eliquis .  Medically stable for discharge. Assessment and Plan:  Acute deep vein thrombosis (DVT) of left lower extremity (HCC) status post thrombectomy. Patient just had a thrombectomy today, doing well.  Has some swelling in the legs.  Patient treated with heparin , changed to Eliquis  at discharge.   Psoriasis - The patient gets outpatient injection of Tremfya. - Will continue Kenalog  ointment. Follow-up with PCP as outpatient.   GERD without esophagitis - Will continue PPI therapy.   BPH (benign prostatic hyperplasia) - We will continue Flomax .   Gout - Will continue allopurinol  and colchicine .   Dyslipidemia - Will continue statin therapy.   Class I obesity with BMI 33.15. Diet exercise.      Consultants: Vascular Procedures performed: Leg vascular thrombectomy. Disposition: Home Diet recommendation:  Discharge Diet Orders (From admission, onward)     Start     Ordered   11/06/23 0000  Diet - low sodium heart healthy        11/06/23  1016           Cardiac diet DISCHARGE MEDICATION: Allergies as of 11/06/2023       Reactions   Other    pollens        Medication List     STOP taking these medications    diclofenac 50 MG EC tablet Commonly known as: VOLTAREN   etanercept 50 MG/ML injection Commonly known as: ENBREL       TAKE these medications    albuterol 108 (90 Base) MCG/ACT inhaler Commonly known as: VENTOLIN HFA Inhale 2 puffs into the lungs every 6 (six) hours as needed for wheezing or shortness of breath.   allopurinol  100 MG tablet Commonly known as: ZYLOPRIM  Take 100 mg by mouth 2 (two) times daily.   apixaban  5 MG Tabs tablet Commonly known as: ELIQUIS  Take 2 tablets (10 mg total) by mouth 2 (two) times daily for 7 days, THEN 1 tablet (5 mg total) 2 (two) times daily. Start taking on: November 06, 2023   atorvastatin  40 MG tablet Commonly known as: LIPITOR TAKE ONE TABLET BY MOUTH AT BEDTIME FOR CHOLESTEROL   augmented betamethasone dipropionate 0.05 % cream Commonly known as: DIPROLENE-AF APPLY SMALL AMOUNT TO AFFECTED AREA TWICE A DAY (USE ON PSORIASIS TWICE A DAY FOR 2-3 WEEKS, THEN ONLY ON WEEKENDS - CAN USE MORE FREQUENTLY FOR FLARES - DO NOT ISE MORE THAN 14 DAYS A MONTH ON THE SAME AREA) (CONVERTED FROM CLOBETASOL)   Cholecalciferol  50 MCG (2000 UT) Tabs TAKE ONE TABLET BY MOUTH DAILY --- START TAKING AFTER COMPLETING ONCE-WEEKLY ERGOCALCIFEROL   colchicine  0.6 MG tablet Day 1: Take 2 tablets (1.2 mg) at the first sign of flare, followed by 0.6 mg after 1 hour; maximum total dose: 3 tablets on day 1. Day 2 and thereafter: Take 1 or 2 tablets daily until flare resolves. What changed:  when to take this reasons to take this   hydrochlorothiazide  25 MG tablet Commonly known as: HYDRODIURIL  Take by mouth.   ixekizumab 80 MG/ML pen Commonly known as: TALTZ INJECT 80MG  (1 AUTOINJECTOR) SUBCUTANEOUSLY EVERY 4 WEEKS FOR PSORIASIS   lisinopril  20 MG tablet Commonly known  as: ZESTRIL  Take 1 tablet by mouth daily.   pantoprazole  40 MG tablet Commonly known as: PROTONIX  TAKE ONE TABLET BY MOUTH TWICE A DAY FOR GERD FOR REFLUX/HEARTBURN (TAKE ON AN EMPTY STOMACH 30 MINUTES PRIOR TO A MEAL)   tamsulosin  0.4 MG Caps capsule Commonly known as: FLOMAX  TAKE ONE CAPSULE BY MOUTH DAILY (TAKE AT THE SAME TIME EACH DAY 30 MINUTES AFTER A MEAL)   triamcinolone  ointment 0.1 % Commonly known as: KENALOG  Apply topically 2 (two) times daily. What changed: See the new instructions.        Follow-up Information     Dew, Selinda RAMAN, MD Follow up in 6 week(s).   Specialties: Vascular Surgery, Radiology, Interventional Cardiology Why: Bilateral lower extremity venous ultrasounds for left lower DVT Contact information: [address] [place] [address] [phone]         Lorelle Hussar, MD .   Specialty: Orthopedic Surgery Contact information: [address] [place] [address] [phone]         [facility] Follow up in 1 week(s).   Contact information: [address] [place] [address] [phone]                Discharge Exam: Fredricka Weights   11/02/23 1615 11/02/23 2027  Weight: 97.5 kg 98.9 kg   General exam: Appears calm and comfortable  Respiratory system: Clear to auscultation. Respiratory effort normal. Cardiovascular system: S1 & S2 heard, RRR. No JVD, murmurs, rubs, gallops or clicks. No pedal edema. Gastrointestinal system: Abdomen is nondistended, soft and nontender. No organomegaly or masses felt. Normal bowel sounds heard. Central nervous system: Alert and oriented. No focal neurological deficits. Extremities: Symmetric 5 x 5 power. Skin: No rashes, lesions or ulcers Psychiatry: Judgement and insight appear normal. Mood & affect appropriate.    Condition at discharge: good  The results of significant diagnostics from this hospitalization (including imaging, microbiology,  ancillary and laboratory) are listed below for reference.   Imaging Studies: PERIPHERAL VASCULAR CATHETERIZATION Result Date: 11/05/2023 See surgical note for result.  CT CHEST WO CONTRAST Result Date: 11/03/2023 CLINICAL DATA:  Deep venous thrombosis.  Evaluate for malignancy. EXAM: CT CHEST WITHOUT CONTRAST TECHNIQUE: Multidetector CT imaging of the chest was performed following the standard protocol without IV contrast. RADIATION DOSE REDUCTION: This exam was performed according to the departmental dose-optimization program which includes automated exposure control, adjustment of the mA and/or kV according to patient size and/or use of iterative reconstruction technique. COMPARISON:  None Available. FINDINGS: Cardiovascular: Aortic atherosclerosis. Normal heart size, without pericardial effusion. Left main and 3 vessel coronary artery calcification. Aortic valve calcification. Mediastinum/Nodes: No mediastinal or hilar adenopathy, given limitations of unenhanced CT. Lungs/Pleura: No pleural fluid.  Clear lungs. Upper Abdomen: Dependent gallstone. Normal imaged portions of the liver, spleen, stomach, pancreas, adrenal glands. Musculoskeletal: No acute osseous abnormality. IMPRESSION: 1. No acute process or evidence of active malignancy within the  chest. 2. Aortic valvular calcifications. Consider echocardiography to evaluate for valvular dysfunction. 3. Coronary artery atherosclerosis. Aortic Atherosclerosis (ICD10-I70.0). 4. Cholelithiasis Electronically Signed   By: Rockey Kilts M.D.   On: 11/03/2023 11:10   ECHOCARDIOGRAM COMPLETE Result Date: 11/03/2023    ECHOCARDIOGRAM REPORT   Patient Name:   TYRICK DUNAGAN Date of Exam: 11/03/2023 Medical Rec #:  [id number]    Height:       68.0 in Accession #:    [id number]   Weight:       218.0 lb Date of Birth:  1955/08/17     BSA:          2.120 m Patient Age:    68 years     BP:           126/87 mmHg Patient Gender: M            HR:           86 bpm. Exam  Location:  [facility] Procedure: 2D Echo, Color Doppler and Cardiac Doppler (Both Spectral and Color            Flow Doppler were utilized during procedure). Indications:     DVT ( Deep venous thrombosis)  History:         Patient has no prior history of Echocardiogram examinations.                  Risk Factors:Hypertension.  Sonographer:     Christopher Furnace Referring Phys:  [id number] JAN A MANSY Diagnosing Phys: Lonni Hanson MD IMPRESSIONS  1. Left ventricular ejection fraction, by estimation, is 55 to 60%. The left ventricle has normal function. Left ventricular endocardial border not optimally defined to evaluate regional wall motion. There is moderate left ventricular hypertrophy. Left ventricular diastolic parameters are indeterminate.  2. Right ventricular systolic function is low normal. The right ventricular size is normal.  3. The mitral valve is normal in structure. No evidence of mitral valve regurgitation. No evidence of mitral stenosis.  4. The aortic valve was not well visualized. There is mild calcification of the aortic valve. Aortic valve regurgitation is not visualized. No aortic stenosis is present. FINDINGS  Left Ventricle: Left ventricular ejection fraction, by estimation, is 55 to 60%. The left ventricle has normal function. Left ventricular endocardial border not optimally defined to evaluate regional wall motion. The left ventricular internal cavity size was normal in size. There is moderate left ventricular hypertrophy. Left ventricular diastolic parameters are indeterminate. Right Ventricle: The right ventricular size is normal. No increase in right ventricular wall thickness. Right ventricular systolic function is low normal. Left Atrium: Left atrial size was normal in size. Right Atrium: Right atrial size was normal in size. Pericardium: Trivial pericardial effusion is present. Mitral Valve: The mitral valve is normal in structure. No evidence of mitral valve regurgitation. No evidence of mitral  valve stenosis. MV peak gradient, 3.4 mmHg. The mean mitral valve gradient is 2.0 mmHg. Tricuspid Valve: The tricuspid valve is not well visualized. Tricuspid valve regurgitation is trivial. Aortic Valve: The aortic valve was not well visualized. There is mild calcification of the aortic valve. Aortic valve regurgitation is not visualized. No aortic stenosis is present. Aortic valve mean gradient measures 3.0 mmHg. Aortic valve peak gradient  measures 4.4 mmHg. Aortic valve area, by VTI measures 2.70 cm. Pulmonic Valve: The pulmonic valve was not well visualized. Pulmonic valve regurgitation is not visualized. No evidence of pulmonic stenosis. Aorta: The aortic root is normal in  size and structure. Pulmonary Artery: The pulmonary artery is not well seen. Venous: The inferior vena cava was not well visualized. IAS/Shunts: The interatrial septum was not well visualized.  LEFT VENTRICLE PLAX 2D LVIDd:         4.50 cm   Diastology LVIDs:         3.10 cm   LV e' medial:    8.59 cm/s LV PW:         1.51 cm   LV E/e' medial:  10.7 LV IVS:        1.20 cm   LV e' lateral:   5.66 cm/s LVOT diam:     2.20 cm   LV E/e' lateral: 16.3 LV SV:         52 LV SV Index:   25 LVOT Area:     3.80 cm  RIGHT VENTRICLE RV Basal diam:  3.30 cm RV Mid diam:    3.20 cm RV S prime:     11.00 cm/s TAPSE (M-mode): 1.7 cm LEFT ATRIUM             Index        RIGHT ATRIUM           Index LA diam:        3.80 cm 1.79 cm/m   RA Area:     15.20 cm LA Vol (A2C):   48.2 ml 22.73 ml/m  RA Volume:   31.30 ml  14.76 ml/m LA Vol (A4C):   27.5 ml 12.97 ml/m LA Biplane Vol: 36.6 ml 17.26 ml/m  AORTIC VALVE AV Area (Vmax):    2.95 cm AV Area (Vmean):   2.54 cm AV Area (VTI):     2.70 cm AV Vmax:           105.00 cm/s AV Vmean:          75.300 cm/s AV VTI:            0.194 m AV Peak Grad:      4.4 mmHg AV Mean Grad:      3.0 mmHg LVOT Vmax:         81.40 cm/s LVOT Vmean:        50.400 cm/s LVOT VTI:          0.138 m LVOT/AV VTI ratio: 0.71  AORTA Ao  Root diam: 3.50 cm MITRAL VALVE MV Area (PHT): 4.39 cm    SHUNTS MV Area VTI:   2.80 cm    Systemic VTI:  0.14 m MV Peak grad:  3.4 mmHg    Systemic Diam: 2.20 cm MV Mean grad:  2.0 mmHg MV Vmax:       0.92 m/s MV Vmean:      67.0 cm/s MV Decel Time: 173 msec MV E velocity: 92.20 cm/s MV A velocity: 74.90 cm/s MV E/A ratio:  1.23 Lonni End MD Electronically signed by Lonni Hanson MD Signature Date/Time: 11/03/2023/10:17:33 AM    Final    US  Venous Img Lower Unilateral Left Result Date: 11/02/2023 CLINICAL DATA:  Left leg swelling and redness. EXAM: LEFT LOWER EXTREMITY VENOUS DOPPLER ULTRASOUND TECHNIQUE: Gray-scale sonography with graded compression, as well as color Doppler and duplex ultrasound were performed to evaluate the lower extremity deep venous systems from the level of the common femoral vein and including the common femoral, femoral, profunda femoral, popliteal and calf veins including the posterior tibial, peroneal and gastrocnemius veins when visible. The superficial great saphenous vein was also interrogated. Spectral Doppler was  utilized to evaluate flow at rest and with distal augmentation maneuvers in the common femoral, femoral and popliteal veins. COMPARISON:  None Available. FINDINGS: Contralateral Common Femoral Vein: Respiratory phasicity is normal and symmetric with the symptomatic side. No evidence of thrombus. Normal compressibility. Common Femoral Vein: Evidence of nonocclusive thrombus within the LEFT common femoral vein with abnormal compressibility, respiratory phasicity and response to augmentation. Saphenofemoral Junction: Evidence of nonocclusive thrombus within the LEFT saphenofemoral junction with abnormal compressibility and flow on color Doppler imaging. Profunda Femoral Vein: Evidence of nonocclusive thrombus within the LEFT profundus femoral vein with abnormal compressibility and flow on color Doppler imaging. Femoral Vein: Evidence of occlusive thrombus within  the LEFT femoral vein with abnormal compressibility, respiratory phasicity and response to augmentation. Popliteal Vein: Evidence of occlusive thrombus within the LEFT popliteal vein with abnormal compressibility, respiratory phasicity and response to augmentation. Calf Veins: Evidence of nonocclusive thrombus within the LEFT posterior tibial vein and LEFT peroneal vein with abnormal compressibility and flow on color Doppler imaging. Superficial Great Saphenous Vein: No evidence of thrombus. Normal compressibility. Venous Reflux:  None. Other Findings:  None. 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   On: 11/02/2023 19:13   CT Angio Aortobifemoral W and/or Wo Contrast Result Date: 11/02/2023 CLINICAL DATA:  Left foot with sudden on swelling, pallor, numbness. Unable to palpate pulse. r LLE redness and swelling since 2 hours ago. Has red plaques from psoriasis to same leg. Redness and heat has extended to just above knee. Denies injury. Denies pain. Denies SOB. EXAM: CT ANGIOGRAPHY OF ABDOMINAL AORTA WITH ILIOFEMORAL RUNOFF TECHNIQUE: Multidetector CT imaging of the abdomen, pelvis and lower extremities was performed using the standard protocol during bolus administration of intravenous contrast. Multiplanar CT image reconstructions and MIPs were obtained to evaluate the vascular anatomy. RADIATION DOSE REDUCTION: This exam was performed according to the departmental dose-optimization program which includes automated exposure control, adjustment of the mA and/or kV according to patient size and/or use of iterative reconstruction technique. CONTRAST:  OMNIPAQUE  IOHEXOL  350 MG/ML SOLN COMPARISON:  None Available. FINDINGS: VASCULAR Aorta: Severe calcified and noncalcified atherosclerotic plaque. Normal caliber aorta without aneurysm, dissection, vasculitis or significant stenosis.  Celiac: Patent without evidence of aneurysm, dissection, vasculitis or significant stenosis. SMA: Patent without evidence of aneurysm, dissection, vasculitis or significant stenosis. Renals: Both renal arteries are patent without evidence of aneurysm, dissection, vasculitis, fibromuscular dysplasia or significant stenosis. IMA: Patent without evidence of aneurysm, dissection, vasculitis or significant stenosis. RIGHT Lower Extremity Inflow: Moderate atherosclerotic plaque. Common, internal and external iliac arteries are patent without evidence of aneurysm, dissection, vasculitis or significant stenosis. Outflow: Moderate atherosclerotic plaque. Markedly limited evaluation of the popliteal arteries bilaterally due to timing of contrast.Common, superficial and profunda femoral arteries are patent without evidence of aneurysm, dissection, vasculitis or significant stenosis. Runoff: Moderate atherosclerotic plaque. Discontinuous patency of the anterior tibial artery due to atherosclerotic plaque. Patent posterior tibial and peroneal vessel runoff to the ankle on the delayed images. LEFT Lower Extremity Inflow: Moderate atherosclerotic plaque. Common, internal and external iliac arteries are patent without evidence of aneurysm, dissection, vasculitis or significant stenosis. Outflow: Moderate atherosclerotic plaque. Markedly limited evaluation of the popliteal arteries bilaterally due to timing of contrast. Common, superficial and profunda femoral arteries are patent without evidence of aneurysm, dissection, vasculitis or significant stenosis. Runoff: Moderate atherosclerotic plaque. Discontinuous patency of the anterior tibial artery due to atherosclerotic plaque.  Patent posterior tibial and peroneal vessel runoff to the ankle on the delayed images that appears slightly delayed compared to the right with no flow noted to the foot. Veins: No obvious venous abnormality within the limitations of this arterial phase study.  Review of the MIP images confirms the above findings. NON-VASCULAR Lower chest: No acute abnormality. Hepatobiliary: No focal liver abnormality. Calcified gallstone noted within the gallbladder lumen. No gallbladder wall thickening or pericholecystic fluid. No biliary dilatation. Pancreas: No focal lesion. Normal pancreatic contour. No surrounding inflammatory changes. No main pancreatic ductal dilatation. Spleen: Normal in size without focal abnormality. Adrenals/Urinary Tract: No adrenal nodule bilaterally. Bilateral kidneys enhance symmetrically. No hydronephrosis. No hydroureter. Bilateral nephrolithiasis measuring up to 3 mm on the left and 6 mm on the right. No ureterolithiasis bilaterally. The urinary bladder is unremarkable. Stomach/Bowel: Stomach is within normal limits. No evidence of bowel wall thickening or dilatation. Colonic diverticulosis. Appendix appears normal. Lymphatic: No lymphadenopathy. Reproductive: Prostate is unremarkable. Other: No intraperitoneal free fluid. No intraperitoneal free gas. No organized fluid collection. Musculoskeletal: No abdominal wall hernia or abnormality. No suspicious lytic or blastic osseous lesions. No acute displaced fracture. Multilevel degenerative changes of the spine. Grade 1 anterolisthesis of L4 on L5. Right L5-S1 pseudoarthrosis. IMPRESSION: VASCULAR 1. Markedly limited evaluation of the popliteal arteries bilaterally due to timing of contrast. 2. Discontinuous patency of the bilateral anterior tibial arteries due to atherosclerotic plaque. Otherwise posterior tibial and peroneal arteries vessel runoff to the ankle bilaterally on the delayed images. Left ankle runoff appears slightly delayed compared to the right with no flow noted to the foot. No definite abrupt obstruction of the posterior tibial or peroneal artery. 3.  Aortic Atherosclerosis (ICD10-I70.0). NON-VASCULAR 1. Subcutaneus soft tissue edema of the left leg. 2. Cholelithiasis with no acute  cholecystitis. 3. Nonobstructive bilateral nephrolithiasis. 4. Colonic diverticulosis with no acute diverticulitis. Electronically Signed   By: Morgane  Naveau M.D.   On: 11/02/2023 18:48    Microbiology: Results for orders placed or performed in visit on 09/11/22  Microscopic Examination     Status: None   Collection Time: 09/11/22 10:29 AM   Urine  Result Value Ref Range Status   WBC, UA 0-5 0 - 5 /hpf Final   RBC, Urine 0-2 0 - 2 /hpf Final   Epithelial Cells (non renal) 0-10 0 - 10 /hpf Final   Bacteria, UA Few None seen/Few Final    Labs: CBC: Recent Labs  Lab 11/02/23 1616 11/03/23 0332 11/04/23 0609 11/05/23 0224 11/06/23 0450  WBC 8.9 10.2 9.3 9.8 9.6  NEUTROABS 5.5  --   --   --   --   HGB 14.0 12.2* 12.1* 12.0* 11.7*  HCT 43.0 37.8* 37.0* 37.1* 35.2*  MCV 93.9 94.0 94.9 93.9 94.9  PLT 206 179 168 183 186   Basic Metabolic Panel: Recent Labs  Lab 11/02/23 1616 11/03/23 0332 11/04/23 0609 11/06/23 0813  NA 139 141 140 141  K 4.1 4.0 4.6 4.0  CL 108 108 105 107  CO2 23 24 27 28   GLUCOSE 116* 89 102* 98  BUN 16 14 19 17   CREATININE 1.14 1.11 1.26* 1.23  CALCIUM  9.2 9.1 9.3 9.2   Liver Function Tests: Recent Labs  Lab 11/02/23 1616  AST 23  ALT 24  ALKPHOS 71  BILITOT 1.0  PROT 7.5  ALBUMIN 3.8   CBG: No results for input(s): GLUCAP in the last 168 hours.  Discharge time spent: greater than 30 minutes.  Signed: Murvin  Laurita, MD [facility] 11/06/2023

## 2023-11-06 NOTE — Progress Notes (Signed)
 PHARMACY - ANTICOAGULATION CONSULT NOTE  Pharmacy Consult for heparin  dosage adjustment  Indication: DVT  Patient Measurements: Height: 5' 8 (172.7 cm) Weight: 98.9 kg (218 lb 0.6 oz) IBW/kg (Calculated) : 68.4 HEPARIN  DW (KG): 89.1  Labs: Recent Labs    11/04/23 0609 11/05/23 0224 11/06/23 0450  HGB 12.1* 12.0* 11.7*  HCT 37.0* 37.1* 35.2*  PLT 168 183 186  HEPARINUNFRC 0.58 0.68 0.56  CREATININE 1.26*  --   --     Estimated Creatinine Clearance: 62.2 mL/min (A) (by C-G formula based on SCr of 1.26 mg/dL (H)).   Medical History: Past Medical History:  Diagnosis Date   Arthritis    Asthma    Back pain 04/16/2011   Gout    Hernia 04/15/2008   Hypertension 04/16/2007   Kidney stone 04/15/1988   Obesity    Psoriasis 04/15/2006   Ulcer    Assessment: 68 year old male presenting with DVT of lower left extremity being managed by heparin  infusion. Notable PMH gout, hyperlipidemia, psoriasis, recurrent nephrolithiasis. Renal function stable with Scr 1.11 mg/dL (unknown baseline) and CrCl 70.6 mL/min. CBC stable with hemoglobin 12.2 g/dL and platelet count of 820 K/uL. No notable drug interactions present.   Goal of Therapy:  Heparin  level 0.3-0.7 units/ml Monitor platelets by anticoagulation protocol: Yes   Plan:  --Heparin  level is therapeutic x 5 --Continue heparin  infusion at 1400 units/hr(likely switching to apixaban  later today) --HL & CBC tomorrow AM  Amori Colomb A Breionna Punt, PharmD Clinical Pharmacist 11/06/2023 5:54 AM

## 2023-11-06 NOTE — Progress Notes (Signed)
 Progress Note    11/06/2023 8:34 AM 1 Day Post-Op  Subjective:   Ryan Gates is a 68 yo male with a significant history of smoking, gout, psoriasis, Hyperlipidemia, HTN presents to [facility] Emergency room on 11/02/23 with LEFT lower extremity swelling, redness and numbness. He is now POD #1 from:   PROCEDURE: 1.   US  guidance for vascular access to left popliteal vein 2.   Catheter placement into left external iliac vein from left popliteal approach 3.   IVC gram and left lower extremity venogram 4.   Mechanical thrombectomy to left popliteal vein, superficial femoral vein, common femoral vein, and external iliac vein with the penumbra 16 flash device 5.   PTA of the left superficial femoral vein and common femoral vein with 6 mm diameter by 25 cm length angioplasty balloon 6.   PTA of left external iliac vein with 12 mm balloon 7.   Stent placement to the left external iliac vein with 16 mm diameter by 10 cm length Venovo stent  Patient resting comfortably in bed. Left lower extremity in coban wrap. Patient endorses leg feels better. Patient remain on heparin  infusion. No complaints overnight. Vital all remain stable.   Vitals:   11/06/23 0405 11/06/23 0800  BP: 112/66 110/70  Pulse: 92 91  Resp: 18 16  Temp: 98.1 F (36.7 C) 98.1 F (36.7 C)  SpO2: 97% 91%   Physical Exam: Cardiac:  RRR, normal S1-S2 no murmurs. Lungs: Clear throughout on auscultation, no rales rhonchi or wheezing noted. Incisions: None Extremities: Bilateral lower extremities are warm to touch this morning palpable pulses.  Right lower extremity with +2 to +3 edema and swelling.  Painful upon ambulation. Abdomen: Positive bowel sounds throughout, soft, nontender and nondistended. Neurologic: Alert and oriented x 4, answers all questions and follows commands appropriately.  CBC    Component Value Date/Time   WBC 9.6 11/06/2023 0450   RBC 3.71 (L) 11/06/2023 0450   HGB 11.7 (L) 11/06/2023 0450   HGB 14.9  03/10/2012 1339   HCT 35.2 (L) 11/06/2023 0450   HCT 44.1 03/10/2012 1339   PLT 186 11/06/2023 0450   PLT 291 03/10/2012 1339   MCV 94.9 11/06/2023 0450   MCV 93 03/10/2012 1339   MCH 31.5 11/06/2023 0450   MCHC 33.2 11/06/2023 0450   RDW 13.6 11/06/2023 0450   RDW 13.4 03/10/2012 1339   LYMPHSABS 2.1 11/02/2023 1616   LYMPHSABS 1.7 03/10/2012 1339   MONOABS 0.9 11/02/2023 1616   MONOABS 0.8 03/10/2012 1339   EOSABS 0.4 11/02/2023 1616   EOSABS 0.4 03/10/2012 1339   BASOSABS 0.0 11/02/2023 1616   BASOSABS 0.1 03/10/2012 1339    BMET    Component Value Date/Time   NA 140 11/04/2023 0609   NA 141 03/10/2012 1339   K 4.6 11/04/2023 0609   K 4.4 03/10/2012 1339   CL 105 11/04/2023 0609   CL 108 (H) 03/10/2012 1339   CO2 27 11/04/2023 0609   CO2 29 03/10/2012 1339   GLUCOSE 102 (H) 11/04/2023 0609   GLUCOSE 78 03/10/2012 1339   BUN 19 11/04/2023 0609   BUN 22 (H) 03/10/2012 1339   CREATININE 1.26 (H) 11/04/2023 0609   CREATININE 1.22 03/10/2012 1339   CALCIUM  9.3 11/04/2023 0609   CALCIUM  9.4 03/10/2012 1339   GFRNONAA >60 11/04/2023 0609   GFRNONAA >60 03/10/2012 1339   GFRAA >60 03/10/2012 1339    INR    Component Value Date/Time   INR 1.0 11/02/2023 1616  Intake/Output Summary (Last 24 hours) at 11/06/2023 0834 Last data filed at 11/05/2023 1921 Gross per 24 hour  Intake 480 ml  Output 150 ml  Net 330 ml     Assessment/Plan:  68 y.o. male is s/p SEE ABOVE 1 Day Post-Op   PLAN Stop heparin  infusion this morning.  Start Eliquis  10 mg BID for 7 days then decrease to 5 mg BID.  Left Leg to stay wrapped for another 24 hours. Okay to discharge home per vascular surgery after receiving oral Eliquis .  Patient to follow up in Vascular Surgery Clinic as scheduled.    DVT prophylaxis:  Heparin  Infusion   Gwendlyn JONELLE [facility]ank [facility] 11/06/2023 8:34 AM

## 2023-11-06 NOTE — Progress Notes (Signed)
 Nursing Discharge Note   Name: Ryan Gates MRN: [id number] DOB: 29-Mar-1956    Admit Date:  11/02/2023  Discharge Date:  11/06/2023   Ryan Gates is to be discharged home per MD order.  AVS completed. Reviewed with patient at bedside. Highlighted copy provided for patient to take home.  Patient able to verbalize understanding of discharge instructions.  PIV removed. Patient stable upon discharge.   TOC Meds to Bed in room with patient. Review new prescription for Eliquis  for DVT.  Verbalized understanding of next dose due for medications.  Vascular Surgery follow-up appointment and discharge instructions reviewed with verbalized understanding.     Discharge Instructions      Vascular Surgery Discharge Instructions:  Do not lift anything heavy for the next week.   Do not drive while taking pain medications  You may remove leg wrap tomorrow and shower  Follow up with Vein and Vascular surgery as scheduled     Discharge Instructions     Diet - low sodium heart healthy   Complete by: As directed    Increase activity slowly   Complete by: As directed         Allergies as of 11/06/2023       Reactions   Other    pollens        Medication List     STOP taking these medications    diclofenac 50 MG EC tablet Commonly known as: VOLTAREN   etanercept 50 MG/ML injection Commonly known as: ENBREL       TAKE these medications    albuterol 108 (90 Base) MCG/ACT inhaler Commonly known as: VENTOLIN HFA Inhale 2 puffs into the lungs every 6 (six) hours as needed for wheezing or shortness of breath.   allopurinol  100 MG tablet Commonly known as: ZYLOPRIM  Take 100 mg by mouth 2 (two) times daily.   atorvastatin  40 MG tablet Commonly known as: LIPITOR TAKE ONE TABLET BY MOUTH AT BEDTIME FOR CHOLESTEROL   augmented betamethasone dipropionate 0.05 % cream Commonly known as: DIPROLENE-AF APPLY SMALL AMOUNT TO AFFECTED AREA TWICE A DAY (USE ON PSORIASIS  TWICE A DAY FOR 2-3 WEEKS, THEN ONLY ON WEEKENDS - CAN USE MORE FREQUENTLY FOR FLARES - DO NOT ISE MORE THAN 14 DAYS A MONTH ON THE SAME AREA) (CONVERTED FROM CLOBETASOL)   Cholecalciferol  50 MCG (2000 UT) Tabs TAKE ONE TABLET BY MOUTH DAILY --- START TAKING AFTER COMPLETING ONCE-WEEKLY ERGOCALCIFEROL    colchicine  0.6 MG tablet Day 1: Take 2 tablets (1.2 mg) at the first sign of flare, followed by 0.6 mg after 1 hour; maximum total dose: 3 tablets on day 1. Day 2 and thereafter: Take 1 or 2 tablets daily until flare resolves. What changed:  when to take this reasons to take this   Eliquis  DVT/PE Starter Pack Generic drug: Apixaban  Starter Pack (10mg  and 5mg ) Take 10 mg by mouth 2 (two) times daily for 7 days, THEN 5 mg 2 (two) times daily. Start taking on: November 06, 2023   hydrochlorothiazide  25 MG tablet Commonly known as: HYDRODIURIL  Take by mouth.   ixekizumab 80 MG/ML pen Commonly known as: TALTZ INJECT 80MG  (1 AUTOINJECTOR) SUBCUTANEOUSLY EVERY 4 WEEKS FOR PSORIASIS   lisinopril  20 MG tablet Commonly known as: ZESTRIL  Take 1 tablet by mouth daily.   pantoprazole  40 MG tablet Commonly known as: PROTONIX  TAKE ONE TABLET BY MOUTH TWICE A DAY FOR GERD FOR REFLUX/HEARTBURN (TAKE ON AN EMPTY STOMACH 30 MINUTES PRIOR TO A MEAL)  tamsulosin  0.4 MG Caps capsule Commonly known as: FLOMAX  TAKE ONE CAPSULE BY MOUTH DAILY (TAKE AT THE SAME TIME EACH DAY 30 MINUTES AFTER A MEAL)   triamcinolone  ointment 0.1 % Commonly known as: KENALOG  Apply topically 2 (two) times daily. What changed: See the new instructions.        Discharge Instructions/ Education: An After Visit Summary was printed and given to the patient. Discharge instructions given to patient with verbalized understanding. Discharge education completed with patient including: follow up instructions, medication list, discharge activities, and limitations if indicated.  Patient able to verbalize understanding, all  questions fully answered. Patient instructed to return to Emergency Department, call 911, or call MD for any changes in condition.   Patient escorted via wheelchair to lobby and discharged home via private automobile.

## 2023-12-17 ENCOUNTER — Other Ambulatory Visit (INDEPENDENT_AMBULATORY_CARE_PROVIDER_SITE_OTHER): Payer: Self-pay | Admitting: Vascular Surgery

## 2023-12-17 DIAGNOSIS — I82412 Acute embolism and thrombosis of left femoral vein: Secondary | ICD-10-CM

## 2023-12-18 ENCOUNTER — Ambulatory Visit (INDEPENDENT_AMBULATORY_CARE_PROVIDER_SITE_OTHER): Payer: Self-pay | Admitting: Nurse Practitioner

## 2023-12-18 ENCOUNTER — Ambulatory Visit (INDEPENDENT_AMBULATORY_CARE_PROVIDER_SITE_OTHER): Payer: Self-pay

## 2023-12-18 ENCOUNTER — Encounter (INDEPENDENT_AMBULATORY_CARE_PROVIDER_SITE_OTHER): Payer: Self-pay | Admitting: Nurse Practitioner

## 2023-12-18 ENCOUNTER — Encounter (INDEPENDENT_AMBULATORY_CARE_PROVIDER_SITE_OTHER): Payer: Self-pay

## 2023-12-18 VITALS — BP 126/89 | HR 92 | Wt 216.2 lb

## 2023-12-18 DIAGNOSIS — I82492 Acute embolism and thrombosis of other specified deep vein of left lower extremity: Secondary | ICD-10-CM | POA: Diagnosis not present

## 2023-12-18 DIAGNOSIS — I82412 Acute embolism and thrombosis of left femoral vein: Secondary | ICD-10-CM

## 2023-12-18 DIAGNOSIS — E785 Hyperlipidemia, unspecified: Secondary | ICD-10-CM | POA: Diagnosis not present

## 2023-12-21 ENCOUNTER — Encounter (INDEPENDENT_AMBULATORY_CARE_PROVIDER_SITE_OTHER): Payer: Self-pay | Admitting: Nurse Practitioner

## 2023-12-21 NOTE — Progress Notes (Signed)
 Subjective:    Patient ID: Ryan Gates, male    DOB: 1956/01/27, 68 y.o.   MRN: [id number] No chief complaint on file.   The patient presents to the office for evaluation of DVT.  DVT was identified at [facility] by Duplex ultrasound.  The initial symptoms were pain and swelling in the lower extremity.  The patient underwent thrombectomy of the left lower extremity on 11/05/2023.  He also has angioplasty and stenting of the left external iliac vein.  The patient notes the affected leg continues to be painful with dependency and swells with dependency.  Symptoms are much better with elevation.  The patient notes minimal edema in the morning which steadily worsens throughout the day.    The patient has not been using compression therapy at this point.  The patient is on anticoagulation.  No SOB or pleuritic chest pains.  No cough or hemoptysis.  No blood per rectum or blood in any sputum.  No excessive bruising per the patient.   No recent shortening of the patient's walking distance or new symptoms consistent with claudication.  No history of rest pain symptoms. No new ulcers or wounds of the lower extremities have occurred.  The patient denies amaurosis fugax or recent TIA symptoms. There are no recent neurological changes noted. No recent episodes of angina or shortness of breath documented.    Today patient still has evidence of thrombus within the femoral and popliteal veins but that is improved from previous studies.     Review of Systems  Cardiovascular:  Positive for leg swelling.  All other systems reviewed and are negative.      Objective:   Physical Exam Vitals reviewed.  HENT:     Head: Normocephalic.  Cardiovascular:     Rate and Rhythm: Normal rate.  Pulmonary:     Effort: Pulmonary effort is normal.  Musculoskeletal:     Left lower leg: Edema present.  Skin:    General: Skin is warm and dry.  Neurological:     Mental Status: He is alert and oriented to person,  place, and time.  Psychiatric:        Mood and Affect: Mood normal.        Behavior: Behavior normal.        Thought Content: Thought content normal.        Judgment: Judgment normal.     BP 126/89   Pulse 92   Wt 216 lb 4 oz (98.1 kg)   BMI 32.88 kg/m   Past Medical History:  Diagnosis Date   Arthritis    Asthma    Back pain 04/16/2011   Gout    Hernia 04/15/2008   Hypertension 04/16/2007   Kidney stone 04/15/1988   Obesity    Psoriasis 04/15/2006   Ulcer     Social History   Socioeconomic History   Marital status: Single    Spouse name: Not on file   Number of children: Not on file   Years of education: Not on file   Highest education level: Not on file  Occupational History   Not on file  Tobacco Use   Smoking status: Former    Types: Cigarettes   Smokeless tobacco: Never  Vaping Use   Vaping status: Never Used  Substance and Sexual Activity   Alcohol use: Not Currently   Drug use: Never   Sexual activity: Not on file  Other Topics Concern   Not on file  Social History Narrative  Not on file   Social Drivers of Health   Financial Resource Strain: Not on file  Food Insecurity: No Food Insecurity (11/02/2023)   Hunger Vital Sign    Worried About Running Out of Food in the Last Year: Never true    Ran Out of Food in the Last Year: Never true  Transportation Needs: No Transportation Needs (11/02/2023)   PRAPARE - Administrator, Civil Service (Medical): No    Lack of Transportation (Non-Medical): No  Physical Activity: Not on file  Stress: Not on file  Social Connections: Moderately Isolated (11/02/2023)   Social Connection and Isolation Panel    Frequency of Communication with Friends and Family: More than three times a week    Frequency of Social Gatherings with Friends and Family: Once a week    Attends Religious Services: Never    Database administrator or Organizations: No    Attends Banker Meetings: Never    Marital  Status: Living with partner  Intimate Partner Violence: Not At Risk (11/02/2023)   Humiliation, Afraid, Rape, and Kick questionnaire    Fear of Current or Ex-Partner: No    Emotionally Abused: No    Physically Abused: No    Sexually Abused: No    Past Surgical History:  Procedure Laterality Date   HERNIA REPAIR  2010   KIDNEY STONE SURGERY  2011   PERIPHERAL VASCULAR THROMBECTOMY Left 11/05/2023   Procedure: PERIPHERAL VASCULAR THROMBECTOMY;  Surgeon: Marea Selinda RAMAN, MD;  Location: [facility] INVASIVE CV LAB;  Service: Cardiovascular;  Laterality: Left;   TONSILLECTOMY  1964    History reviewed. No pertinent family history.  Allergies  Allergen Reactions   Other     pollens       Latest Ref Rng & Units 11/06/2023    4:50 AM 11/05/2023    2:24 AM 11/04/2023    6:09 AM  CBC  WBC 4.0 - 10.5 K/uL 9.6  9.8  9.3   Hemoglobin 13.0 - 17.0 g/dL 88.2  87.9  87.8   Hematocrit 39.0 - 52.0 % 35.2  37.1  37.0   Platelets 150 - 400 K/uL 186  183  168       CMP     Component Value Date/Time   NA 141 11/06/2023 0813   NA 141 03/10/2012 1339   K 4.0 11/06/2023 0813   K 4.4 03/10/2012 1339   CL 107 11/06/2023 0813   CL 108 (H) 03/10/2012 1339   CO2 28 11/06/2023 0813   CO2 29 03/10/2012 1339   GLUCOSE 98 11/06/2023 0813   GLUCOSE 78 03/10/2012 1339   BUN 17 11/06/2023 0813   BUN 22 (H) 03/10/2012 1339   CREATININE 1.23 11/06/2023 0813   CREATININE 1.22 03/10/2012 1339   CALCIUM  9.2 11/06/2023 0813   CALCIUM  9.4 03/10/2012 1339   PROT 7.5 11/02/2023 1616   ALBUMIN 3.8 11/02/2023 1616   AST 23 11/02/2023 1616   ALT 24 11/02/2023 1616   ALKPHOS 71 11/02/2023 1616   BILITOT 1.0 11/02/2023 1616   GFRNONAA >60 11/06/2023 0813   GFRNONAA >60 03/10/2012 1339     No results found.     Assessment & Plan:   1. Acute deep vein thrombosis (DVT) of other specified vein of left lower extremity (HCC) (Primary) Recommend:   No surgery or intervention at this point in time.  IVC filter  is not indicated at present.  Patient's duplex ultrasound of the venous system shows DVT in  the femoral and popliteal veins which is an improvement from previous studies.  The patient is on anticoagulation   Elevation was stressed, such as the use of a recliner.  I have reviewed with the patient DVT and post phlebitic changes such as swelling and why it  causes symptoms such as pain.  I recommended to the patient to wear graduated compression stockings, beginning after three full days of anticoagulation.  Graduated compression should be worn on a daily basis. The patient should wear compression beginning first thing in the morning and removing them in the evening. The patient is instructed specifically not to sleep in the stockings.  In addition, behavioral modification including elevation during the day and avoidance of prolonged dependency will be initiated.    The patient will continue anticoagulation for now as there have not been any problems or complications from anticoagulation therapy at this point.  The patient will follow-up with me with noninvasive studies as ordered.  2. Dyslipidemia Continue statin as ordered and reviewed, no changes at this time   Current Outpatient Medications on File Prior to Visit  Medication Sig Dispense Refill   albuterol (VENTOLIN HFA) 108 (90 Base) MCG/ACT inhaler Inhale 2 puffs into the lungs every 6 (six) hours as needed for wheezing or shortness of breath.     allopurinol  (ZYLOPRIM ) 100 MG tablet Take 100 mg by mouth 2 (two) times daily.     APIXABAN  (ELIQUIS ) VTE STARTER PACK (10MG  AND 5MG ) Take 10 mg by mouth 2 (two) times daily for 7 days, THEN 5 mg 2 (two) times daily. 86 each 0   atorvastatin  (LIPITOR) 40 MG tablet TAKE ONE TABLET BY MOUTH AT BEDTIME FOR CHOLESTEROL     augmented betamethasone dipropionate (DIPROLENE-AF) 0.05 % cream APPLY SMALL AMOUNT TO AFFECTED AREA TWICE A DAY (USE ON PSORIASIS TWICE A DAY FOR 2-3 WEEKS, THEN ONLY ON WEEKENDS -  CAN USE MORE FREQUENTLY FOR FLARES - DO NOT ISE MORE THAN 14 DAYS A MONTH ON THE SAME AREA) (CONVERTED FROM CLOBETASOL)     Cholecalciferol  50 MCG (2000 UT) TABS TAKE ONE TABLET BY MOUTH DAILY --- START TAKING AFTER COMPLETING ONCE-WEEKLY ERGOCALCIFEROL      colchicine  0.6 MG tablet Day 1: Take 2 tablets (1.2 mg) at the first sign of flare, followed by 0.6 mg after 1 hour; maximum total dose: 3 tablets on day 1. Day 2 and thereafter: Take 1 or 2 tablets daily until flare resolves. (Patient taking differently: as needed. Day 1: Take 2 tablets (1.2 mg) at the first sign of flare, followed by 0.6 mg after 1 hour; maximum total dose: 3 tablets on day 1. Day 2 and thereafter: Take 1 or 2 tablets daily until flare resolves.) 30 tablet 0   hydrochlorothiazide  (HYDRODIURIL ) 25 MG tablet Take by mouth. (Patient not taking: Reported on 11/03/2023)     Ixekizumab 80 MG/ML SOAJ INJECT 80MG  (1 AUTOINJECTOR) SUBCUTANEOUSLY EVERY 4 WEEKS FOR PSORIASIS     lisinopril  (ZESTRIL ) 20 MG tablet Take 1 tablet by mouth daily.     pantoprazole  (PROTONIX ) 40 MG tablet TAKE ONE TABLET BY MOUTH TWICE A DAY FOR GERD FOR REFLUX/HEARTBURN (TAKE ON AN EMPTY STOMACH 30 MINUTES PRIOR TO A MEAL)     tamsulosin  (FLOMAX ) 0.4 MG CAPS capsule TAKE ONE CAPSULE BY MOUTH DAILY (TAKE AT THE SAME TIME EACH DAY 30 MINUTES AFTER A MEAL) (Patient not taking: Reported on 11/03/2023)     triamcinolone  ointment (KENALOG ) 0.1 % Apply topically 2 (two) times daily. 30  g 0   No current facility-administered medications on file prior to visit.    There are no Patient Instructions on file for this visit. No follow-ups on file.   Susanne Baumgarner E Jacobo Moncrief, NP

## 2024-01-01 ENCOUNTER — Other Ambulatory Visit (HOSPITAL_COMMUNITY): Payer: Self-pay

## 2024-02-11 ENCOUNTER — Other Ambulatory Visit (INDEPENDENT_AMBULATORY_CARE_PROVIDER_SITE_OTHER): Payer: Self-pay | Admitting: Nurse Practitioner

## 2024-02-11 DIAGNOSIS — I82492 Acute embolism and thrombosis of other specified deep vein of left lower extremity: Secondary | ICD-10-CM

## 2024-02-12 ENCOUNTER — Ambulatory Visit (INDEPENDENT_AMBULATORY_CARE_PROVIDER_SITE_OTHER)

## 2024-02-12 ENCOUNTER — Encounter (INDEPENDENT_AMBULATORY_CARE_PROVIDER_SITE_OTHER): Payer: Self-pay | Admitting: Nurse Practitioner

## 2024-02-12 ENCOUNTER — Ambulatory Visit (INDEPENDENT_AMBULATORY_CARE_PROVIDER_SITE_OTHER): Admitting: Nurse Practitioner

## 2024-02-12 ENCOUNTER — Encounter (INDEPENDENT_AMBULATORY_CARE_PROVIDER_SITE_OTHER)

## 2024-02-12 VITALS — BP 120/80 | HR 116 | Resp 16 | Ht 68.0 in | Wt 221.6 lb

## 2024-02-12 DIAGNOSIS — E785 Hyperlipidemia, unspecified: Secondary | ICD-10-CM

## 2024-02-12 DIAGNOSIS — I82492 Acute embolism and thrombosis of other specified deep vein of left lower extremity: Secondary | ICD-10-CM

## 2024-02-12 DIAGNOSIS — I82412 Acute embolism and thrombosis of left femoral vein: Secondary | ICD-10-CM

## 2024-02-22 ENCOUNTER — Encounter (INDEPENDENT_AMBULATORY_CARE_PROVIDER_SITE_OTHER): Payer: Self-pay | Admitting: Nurse Practitioner

## 2024-02-22 NOTE — Progress Notes (Signed)
 Subjective:    Patient ID: ADRIK Gates, male    DOB: Jun 28, 1955, 68 y.o.   MRN: [id number] Chief Complaint  Patient presents with   Follow-up    8 weeks + Left DVT. f/u venous stent.    The patient presents to the office for evaluation of DVT.  DVT was identified at [facility] by Duplex ultrasound.  The initial symptoms were pain and swelling in the lower extremity.  The patient underwent thrombectomy of the left lower extremity on 11/05/2023.  He also has angioplasty and stenting of the left external iliac vein.  The patient notes the affected leg continues to be painful with dependency and swells with dependency.  Symptoms are much better with elevation.  Since he was last seen he has started wearing compression and he notes that swelling is much improved.  The patient is on anticoagulation.  No SOB or pleuritic chest pains.  No cough or hemoptysis.  No blood per rectum or blood in any sputum.  No excessive bruising per the patient.   No recent shortening of the patient's walking distance or new symptoms consistent with claudication.  No history of rest pain symptoms. No new ulcers or wounds of the lower extremities have occurred.  The patient denies amaurosis fugax or recent TIA symptoms. There are no recent neurological changes noted. No recent episodes of angina or shortness of breath documented.    Today patient still has evidence of thrombus within the femoral and popliteal veins but that is improved from previous studies.     Review of Systems  Cardiovascular:  Positive for leg swelling.  All other systems reviewed and are negative.      Objective:   Physical Exam Vitals reviewed.  HENT:     Head: Normocephalic.  Cardiovascular:     Rate and Rhythm: Normal rate.  Pulmonary:     Effort: Pulmonary effort is normal.  Musculoskeletal:     Left lower leg: Edema present.  Skin:    General: Skin is warm and dry.  Neurological:     Mental Status: He is alert and oriented  to person, place, and time.  Psychiatric:        Mood and Affect: Mood normal.        Behavior: Behavior normal.        Thought Content: Thought content normal.        Judgment: Judgment normal.     BP 120/80   Pulse (!) 116   Resp 16   Ht 5' 8 (1.727 m)   Wt 221 lb 9.6 oz (100.5 kg)   BMI 33.69 kg/m   Past Medical History:  Diagnosis Date   Arthritis    Asthma    Back pain 04/16/2011   Gout    Hernia 04/15/2008   Hypertension 04/16/2007   Kidney stone 04/15/1988   Obesity    Psoriasis 04/15/2006   Ulcer     Social History   Socioeconomic History   Marital status: Single    Spouse name: Not on file   Number of children: Not on file   Years of education: Not on file   Highest education level: Not on file  Occupational History   Not on file  Tobacco Use   Smoking status: Former    Types: Cigarettes   Smokeless tobacco: Never  Vaping Use   Vaping status: Never Used  Substance and Sexual Activity   Alcohol use: Not Currently   Drug use: Never   Sexual  activity: Not on file  Other Topics Concern   Not on file  Social History Narrative   Not on file   Social Drivers of Health   Financial Resource Strain: Not on file  Food Insecurity: No Food Insecurity (11/02/2023)   Hunger Vital Sign    Worried About Running Out of Food in the Last Year: Never true    Ran Out of Food in the Last Year: Never true  Transportation Needs: No Transportation Needs (11/02/2023)   PRAPARE - Administrator, Civil Service (Medical): No    Lack of Transportation (Non-Medical): No  Physical Activity: Not on file  Stress: Not on file  Social Connections: Moderately Isolated (11/02/2023)   Social Connection and Isolation Panel    Frequency of Communication with Friends and Family: More than three times a week    Frequency of Social Gatherings with Friends and Family: Once a week    Attends Religious Services: Never    Database Administrator or Organizations: No     Attends Banker Meetings: Never    Marital Status: Living with partner  Intimate Partner Violence: Not At Risk (11/02/2023)   Humiliation, Afraid, Rape, and Kick questionnaire    Fear of Current or Ex-Partner: No    Emotionally Abused: No    Physically Abused: No    Sexually Abused: No    Past Surgical History:  Procedure Laterality Date   HERNIA REPAIR  2010   KIDNEY STONE SURGERY  2011   PERIPHERAL VASCULAR THROMBECTOMY Left 11/05/2023   Procedure: PERIPHERAL VASCULAR THROMBECTOMY;  Surgeon: Marea Selinda RAMAN, MD;  Location: [facility] INVASIVE CV [facility];  Service: Cardiovascular;  Laterality: Left;   TONSILLECTOMY  1964    History reviewed. No pertinent family history.  Allergies  Allergen Reactions   Other     pollens       Latest Ref Rng & Units 11/06/2023    4:50 AM 11/05/2023    2:24 AM 11/04/2023    6:09 AM  CBC  WBC 4.0 - 10.5 K/uL 9.6  9.8  9.3   Hemoglobin 13.0 - 17.0 g/dL 88.2  87.9  87.8   Hematocrit 39.0 - 52.0 % 35.2  37.1  37.0   Platelets 150 - 400 K/uL 186  183  168       CMP     Component Value Date/Time   NA 141 11/06/2023 0813   NA 141 03/10/2012 1339   K 4.0 11/06/2023 0813   K 4.4 03/10/2012 1339   CL 107 11/06/2023 0813   CL 108 (H) 03/10/2012 1339   CO2 28 11/06/2023 0813   CO2 29 03/10/2012 1339   GLUCOSE 98 11/06/2023 0813   GLUCOSE 78 03/10/2012 1339   BUN 17 11/06/2023 0813   BUN 22 (H) 03/10/2012 1339   CREATININE 1.23 11/06/2023 0813   CREATININE 1.22 03/10/2012 1339   CALCIUM  9.2 11/06/2023 0813   CALCIUM  9.4 03/10/2012 1339   PROT 7.5 11/02/2023 1616   ALBUMIN 3.8 11/02/2023 1616   AST 23 11/02/2023 1616   ALT 24 11/02/2023 1616   ALKPHOS 71 11/02/2023 1616   BILITOT 1.0 11/02/2023 1616   GFRNONAA >60 11/06/2023 0813   GFRNONAA >60 03/10/2012 1339     No results found.     Assessment & Plan:   1. Acute deep vein thrombosis (DVT) of other specified vein of left lower extremity (HCC) (Primary) Recommend:   No  surgery or intervention at this point in time.  IVC filter is not indicated at present.  Patient's duplex ultrasound of the venous system shows DVT in the femoral and popliteal veins which is an improvement from previous studies.  The patient is on anticoagulation    The patient will continue anticoagulation for now as there have not been any problems or complications from anticoagulation therapy at this point.  The patient will follow-up with me with noninvasive studies as ordered.  The patient will continue with conservative therapy as noted including compression elevation and activity.  Will have him return in January to reevaluate and discuss possibility of continuing anticoagulation versus changing to prophylactic dose. 2. Dyslipidemia Continue statin as ordered and reviewed, no changes at this time   Current Outpatient Medications on File Prior to Visit  Medication Sig Dispense Refill   albuterol (VENTOLIN HFA) 108 (90 Base) MCG/ACT inhaler Inhale 2 puffs into the lungs every 6 (six) hours as needed for wheezing or shortness of breath.     allopurinol  (ZYLOPRIM ) 100 MG tablet Take 100 mg by mouth 2 (two) times daily.     APIXABAN  (ELIQUIS ) VTE STARTER PACK (10MG  AND 5MG ) Take 10 mg by mouth 2 (two) times daily for 7 days, THEN 5 mg 2 (two) times daily. 86 each 0   atorvastatin  (LIPITOR) 40 MG tablet TAKE ONE TABLET BY MOUTH AT BEDTIME FOR CHOLESTEROL     augmented betamethasone dipropionate (DIPROLENE-AF) 0.05 % cream APPLY SMALL AMOUNT TO AFFECTED AREA TWICE A DAY (USE ON PSORIASIS TWICE A DAY FOR 2-3 WEEKS, THEN ONLY ON WEEKENDS - CAN USE MORE FREQUENTLY FOR FLARES - DO NOT ISE MORE THAN 14 DAYS A MONTH ON THE SAME AREA) (CONVERTED FROM CLOBETASOL)     colchicine  0.6 MG tablet Day 1: Take 2 tablets (1.2 mg) at the first sign of flare, followed by 0.6 mg after 1 hour; maximum total dose: 3 tablets on day 1. Day 2 and thereafter: Take 1 or 2 tablets daily until flare resolves. 30 tablet 0    Ixekizumab 80 MG/ML SOAJ INJECT 80MG  (1 AUTOINJECTOR) SUBCUTANEOUSLY EVERY 4 WEEKS FOR PSORIASIS     lisinopril  (ZESTRIL ) 20 MG tablet Take 1 tablet by mouth daily.     pantoprazole  (PROTONIX ) 40 MG tablet TAKE ONE TABLET BY MOUTH TWICE A DAY FOR GERD FOR REFLUX/HEARTBURN (TAKE ON AN EMPTY STOMACH 30 MINUTES PRIOR TO A MEAL)     triamcinolone  ointment (KENALOG ) 0.1 % Apply topically 2 (two) times daily. 30 g 0   Cholecalciferol  50 MCG (2000 UT) TABS TAKE ONE TABLET BY MOUTH DAILY --- START TAKING AFTER COMPLETING ONCE-WEEKLY ERGOCALCIFEROL      hydrochlorothiazide  (HYDRODIURIL ) 25 MG tablet Take by mouth. (Patient not taking: Reported on 11/03/2023)     tamsulosin  (FLOMAX ) 0.4 MG CAPS capsule TAKE ONE CAPSULE BY MOUTH DAILY (TAKE AT THE SAME TIME EACH DAY 30 MINUTES AFTER A MEAL) (Patient not taking: Reported on 11/03/2023)     No current facility-administered medications on file prior to visit.    There are no Patient Instructions on file for this visit. No follow-ups on file.   Omid Deardorff E Rayfield Beem, NP

## 2024-05-03 ENCOUNTER — Other Ambulatory Visit (INDEPENDENT_AMBULATORY_CARE_PROVIDER_SITE_OTHER): Payer: Self-pay | Admitting: Nurse Practitioner

## 2024-05-03 DIAGNOSIS — I82492 Acute embolism and thrombosis of other specified deep vein of left lower extremity: Secondary | ICD-10-CM

## 2024-05-05 ENCOUNTER — Ambulatory Visit (INDEPENDENT_AMBULATORY_CARE_PROVIDER_SITE_OTHER): Admitting: Nurse Practitioner

## 2024-05-05 ENCOUNTER — Other Ambulatory Visit (INDEPENDENT_AMBULATORY_CARE_PROVIDER_SITE_OTHER)

## 2024-05-05 ENCOUNTER — Encounter (INDEPENDENT_AMBULATORY_CARE_PROVIDER_SITE_OTHER): Payer: Self-pay | Admitting: Nurse Practitioner

## 2024-05-05 VITALS — BP 123/86 | HR 97 | Resp 18 | Wt 224.6 lb

## 2024-05-05 DIAGNOSIS — E785 Hyperlipidemia, unspecified: Secondary | ICD-10-CM

## 2024-05-05 DIAGNOSIS — I82492 Acute embolism and thrombosis of other specified deep vein of left lower extremity: Secondary | ICD-10-CM

## 2024-05-05 DIAGNOSIS — I82412 Acute embolism and thrombosis of left femoral vein: Secondary | ICD-10-CM

## 2024-05-09 ENCOUNTER — Encounter (INDEPENDENT_AMBULATORY_CARE_PROVIDER_SITE_OTHER): Payer: Self-pay | Admitting: Nurse Practitioner

## 2024-05-09 NOTE — Progress Notes (Signed)
 "  Subjective:    Patient ID: Ryan Gates, male    DOB: 1955-06-30, 69 y.o.   MRN: [id number] Chief Complaint  Patient presents with   Follow-up    Left dvt u/s follow up    HPI  Discussed the use of AI scribe software for clinical note transcription with the patient, who gave verbal consent to proceed.  History of Present Illness Ryan Gates is a 69 year old male with left lower extremity deep vein thrombosis who presents for vascular surgery follow-up regarding anticoagulation management.  He was diagnosed with left lower extremity deep vein thrombosis approximately six months ago and has been treated with apixaban  5 mg twice daily since diagnosis. He currently has a 30-day supply of apixaban  remaining. He expresses anxiety regarding the risk of recurrent thrombosis, particularly due to the size of his initial clot.  Initially, he experienced swelling and pain in the left leg with radiation to the back and buttock, which have since resolved. He intermittently used compression socks following medical advice and attributes improvement in swelling to his use. He is not currently wearing compression socks regularly and has not noted recurrent swelling or pain.  He has discussed weight loss injections with his [facility] provider but was informed that anticoagulation and history of thrombosis may be contraindications. He is awaiting further information from the [place] pharmacy. He has reduced exertion by using a riding lawnmower.    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  All other systems reviewed and are negative.      Objective:   Physical Exam Vitals reviewed.  HENT:     Head: Normocephalic.  Cardiovascular:     Rate and Rhythm: Normal rate and regular rhythm.  Pulmonary:     Effort: Pulmonary effort  is normal.  Musculoskeletal:     Right lower leg: No edema.     Left lower leg: No edema.  Skin:    General: Skin is warm and dry.  Neurological:     Mental Status: He is alert and oriented to person, place, and time.  Psychiatric:        Mood and Affect: Mood normal.        Behavior: Behavior normal.        Thought Content: Thought content normal.        Judgment: Judgment normal.     Physical Exam    BP 123/86 (BP Location: Left Arm)   Pulse 97   Resp 18   Wt 224 lb 9.6 oz (101.9 kg)   BMI 34.15 kg/m   Past Medical History:  Diagnosis Date   Arthritis    Asthma    Back pain 04/16/2011   Gout    Hernia 04/15/2008   Hypertension 04/16/2007   Kidney stone 04/15/1988   Obesity    Psoriasis 04/15/2006   Ulcer     Social History   Socioeconomic History   Marital status: Single    Spouse name: Not on file   Number of children: Not on file   Years of education: Not on file   Highest education level: Not on file  Occupational History   Not on file  Tobacco Use   Smoking status: Former    Types: Cigarettes   Smokeless tobacco: Never  Vaping Use   Vaping status: Never Used  Substance and  Sexual Activity   Alcohol use: Not Currently   Drug use: Never   Sexual activity: Not on file  Other Topics Concern   Not on file  Social History Narrative   Not on file   Social Drivers of Health   Tobacco Use: Medium Risk (05/09/2024)   Patient History    Smoking Tobacco Use: Former    Smokeless Tobacco Use: Never    Passive Exposure: Not on Actuary Strain: Not on file  Food Insecurity: No Food Insecurity (11/02/2023)   Epic    Worried About Programme Researcher, Broadcasting/film/video in the Last Year: Never true    Ran Out of Food in the Last Year: Never true  Transportation Needs: No Transportation Needs (11/02/2023)   Epic    Lack of Transportation (Medical): No    Lack of Transportation (Non-Medical): No  Physical Activity: Not on file  Stress: Not on file   Social Connections: Moderately Isolated (11/02/2023)   Social Connection and Isolation Panel    Frequency of Communication with Friends and Family: More than three times a week    Frequency of Social Gatherings with Friends and Family: Once a week    Attends Religious Services: Never    Database Administrator or Organizations: No    Attends Banker Meetings: Never    Marital Status: Living with partner  Intimate Partner Violence: Not At Risk (11/02/2023)   Epic    Fear of Current or Ex-Partner: No    Emotionally Abused: No    Physically Abused: No    Sexually Abused: No  Depression (PHQ2-9): Not on file  Alcohol Screen: Not on file  Housing: Low Risk (11/02/2023)   Epic    Unable to Pay for Housing in the Last Year: No    Number of Times Moved in the Last Year: 0    Homeless in the Last Year: No  Utilities: Not At Risk (11/02/2023)   Epic    Threatened with loss of utilities: No  Health Literacy: Not on file    Past Surgical History:  Procedure Laterality Date   HERNIA REPAIR  2010   KIDNEY STONE SURGERY  2011   PERIPHERAL VASCULAR THROMBECTOMY Left 11/05/2023   Procedure: PERIPHERAL VASCULAR THROMBECTOMY;  Surgeon: Marea Selinda RAMAN, MD;  Location: [facility] IN[facility];  Service: Cardiovascular;  Laterality: Left;   TONSILLECTOMY  1964    History reviewed. No pertinent family history.  Allergies[1]     Latest Ref Rng & Units 11/06/2023    4:50 AM 11/05/2023    2:24 AM 11/04/2023    6:09 AM  CBC  WBC 4.0 - 10.5 K/uL 9.6  9.8  9.3   Hemoglobin 13.0 - 17.0 g/dL 88.2  87.9  87.8   Hematocrit 39.0 - 52.0 % 35.2  37.1  37.0   Platelets 150 - 400 K/uL 186  183  168       CMP     Component Value Date/Time   NA 141 11/06/2023 0813   NA 141 03/10/2012 1339   K 4.0 11/06/2023 0813   K 4.4 03/10/2012 1339   CL 107 11/06/2023 0813   CL 108 (H) 03/10/2012 1339   CO2 28 11/06/2023 0813   CO2 29 03/10/2012 1339   GLUCOSE 98 11/06/2023 0813   GLUCOSE 78 03/10/2012  1339   BUN 17 11/06/2023 0813   BUN 22 (H) 03/10/2012 1339   CREATININE 1.23 11/06/2023 0813   CREATININE 1.22 03/10/2012 1339  CALCIUM  9.2 11/06/2023 0813   CALCIUM  9.4 03/10/2012 1339   PROT 7.5 11/02/2023 1616   ALBUMIN 3.8 11/02/2023 1616   AST 23 11/02/2023 1616   ALT 24 11/02/2023 1616   ALKPHOS 71 11/02/2023 1616   BILITOT 1.0 11/02/2023 1616   GFRNONAA >60 11/06/2023 0813   GFRNONAA >60 03/10/2012 1339     No results found.     Assessment & Plan:   1. DVT of deep femoral vein, left (HCC) (Primary) Deep vein thrombosis of the left lower extremity Six months post-acute DVT with residual chronic thrombus behind the knee. Asymptomatic, approaching six months of apixaban . Discussed anticoagulation options and risks. He prefers to continue apixaban  due to anxiety about recurrence. - Reviewed anticoagulation options: discontinuation, dose reduction to apixaban  2.5 mg BID, or continuation at 5 mg BID. - Discussed recurrence risk (1% on half-dose, 4-5% off anticoagulation) and ongoing bleeding risk with continued therapy. - Engaged in shared decision making; he elected to continue apixaban  for now, at least until current supply is exhausted. - Advised not to refill apixaban  if he chooses to discontinue therapy. - Instructed to monitor for signs of recurrent DVT (abrupt swelling, pain, significant discomfort) and seek emergency care if these occur. - Recommended keeping compression stockings available and using as needed, especially if swelling recurs, for prolonged travel, or in hot weather. - Discussed potential interaction between weight loss pharmacotherapy and anticoagulation; advised to coordinate with primary care regarding timing of weight loss therapy and anticoagulation discontinuation if indicated. - Emphasized importance of hydration, particularly during hot weather or exertion. - Recommended follow-up in six months to reassess symptoms and management plan. - Instructed  to ensure [facility] coverage/authorization for continued vascular surgery follow-up.  2. Dyslipidemia Continue statin as ordered and reviewed, no changes at this time   Assessment and Plan Assessment & Plan      Medications Ordered Prior to Encounter[2]  There are no Patient Instructions on file for this visit. Return in about 6 months (around 11/02/2024) for No studies see JD/FB.   Malacki Mcphearson E Lariyah Shetterly, NP       [1]  Allergies Allergen Reactions   Other     pollens  [2]  Current Outpatient Medications on File Prior to Visit  Medication Sig Dispense Refill   albuterol (VENTOLIN HFA) 108 (90 Base) MCG/ACT inhaler Inhale 2 puffs into the lungs every 6 (six) hours as needed for wheezing or shortness of breath.     allopurinol  (ZYLOPRIM ) 100 MG tablet Take 100 mg by mouth 2 (two) times daily.     apixaban  (ELIQUIS ) 5 MG TABS tablet Take 5 mg by mouth 2 (two) times daily.     atorvastatin  (LIPITOR) 40 MG tablet TAKE ONE TABLET BY MOUTH AT BEDTIME FOR CHOLESTEROL     augmented betamethasone dipropionate (DIPROLENE-AF) 0.05 % cream APPLY SMALL AMOUNT TO AFFECTED AREA TWICE A DAY (USE ON PSORIASIS TWICE A DAY FOR 2-3 WEEKS, THEN ONLY ON WEEKENDS - CAN USE MORE FREQUENTLY FOR FLARES - DO NOT ISE MORE THAN 14 DAYS A MONTH ON THE SAME AREA) (CONVERTED FROM CLOBETASOL)     Cholecalciferol  50 MCG (2000 UT) TABS TAKE ONE TABLET BY MOUTH DAILY --- START TAKING AFTER COMPLETING ONCE-WEEKLY ERGOCALCIFEROL      colchicine  0.6 MG tablet Day 1: Take 2 tablets (1.2 mg) at the first sign of flare, followed by 0.6 mg after 1 hour; maximum total dose: 3 tablets on day 1. Day 2 and thereafter: Take 1 or 2 tablets daily until  flare resolves. 30 tablet 0   Ixekizumab 80 MG/ML SOAJ INJECT 80MG  (1 AUTOINJECTOR) SUBCUTANEOUSLY EVERY 4 WEEKS FOR PSORIASIS     lisinopril  (ZESTRIL ) 20 MG tablet Take 1 tablet by mouth daily.     pantoprazole  (PROTONIX ) 40 MG tablet TAKE ONE TABLET BY MOUTH TWICE A DAY FOR GERD FOR  REFLUX/HEARTBURN (TAKE ON AN EMPTY STOMACH 30 MINUTES PRIOR TO A MEAL)     triamcinolone  ointment (KENALOG ) 0.1 % Apply topically 2 (two) times daily. 30 g 0   APIXABAN  (ELIQUIS ) VTE STARTER PACK (10MG  AND 5MG ) Take 10 mg by mouth 2 (two) times daily for 7 days, THEN 5 mg 2 (two) times daily. (Patient not taking: No sig reported) 86 each 0   hydrochlorothiazide  (HYDRODIURIL ) 25 MG tablet Take by mouth. (Patient not taking: Reported on 05/05/2024)     tamsulosin  (FLOMAX ) 0.4 MG CAPS capsule TAKE ONE CAPSULE BY MOUTH DAILY (TAKE AT THE SAME TIME EACH DAY 30 MINUTES AFTER A MEAL) (Patient not taking: Reported on 05/05/2024)     No current facility-administered medications on file prior to visit.   "

## 2024-11-02 ENCOUNTER — Ambulatory Visit (INDEPENDENT_AMBULATORY_CARE_PROVIDER_SITE_OTHER): Admitting: Vascular Surgery
